# Patient Record
Sex: Female | Born: 1975 | Race: White | Hispanic: No | Marital: Single | Smoking: Current every day smoker
Health system: Southern US, Community
[De-identification: ages and names within clinical notes are randomized; demographics above are authoritative.]

## PROBLEM LIST (undated history)

## (undated) ENCOUNTER — Emergency Department (HOSPITAL_COMMUNITY): Admission: EM | Payer: Medicaid Other | Source: Home / Self Care

## (undated) DIAGNOSIS — I10 Essential (primary) hypertension: Secondary | ICD-10-CM

## (undated) DIAGNOSIS — N200 Calculus of kidney: Secondary | ICD-10-CM

## (undated) HISTORY — PX: EYE SURGERY: SHX253

## (undated) HISTORY — PX: ABDOMINAL HYSTERECTOMY: SHX81

## (undated) HISTORY — PX: CHOLECYSTECTOMY: SHX55

## (undated) HISTORY — PX: NECK SURGERY: SHX720

---

## 2000-09-23 ENCOUNTER — Encounter: Payer: Self-pay | Admitting: Internal Medicine

## 2000-09-23 ENCOUNTER — Emergency Department (HOSPITAL_COMMUNITY): Admission: EM | Admit: 2000-09-23 | Discharge: 2000-09-23 | Payer: Self-pay | Admitting: Internal Medicine

## 2001-03-06 ENCOUNTER — Emergency Department (HOSPITAL_COMMUNITY): Admission: EM | Admit: 2001-03-06 | Discharge: 2001-03-06 | Payer: Self-pay | Admitting: Emergency Medicine

## 2001-03-06 ENCOUNTER — Encounter: Payer: Self-pay | Admitting: Emergency Medicine

## 2002-01-09 ENCOUNTER — Emergency Department (HOSPITAL_COMMUNITY): Admission: EM | Admit: 2002-01-09 | Discharge: 2002-01-09 | Payer: Self-pay | Admitting: *Deleted

## 2002-01-14 ENCOUNTER — Encounter: Payer: Self-pay | Admitting: *Deleted

## 2002-01-14 ENCOUNTER — Ambulatory Visit (HOSPITAL_COMMUNITY): Admission: RE | Admit: 2002-01-14 | Discharge: 2002-01-14 | Payer: Self-pay | Admitting: *Deleted

## 2002-02-04 ENCOUNTER — Inpatient Hospital Stay (HOSPITAL_COMMUNITY): Admission: RE | Admit: 2002-02-04 | Discharge: 2002-02-07 | Payer: Self-pay | Admitting: *Deleted

## 2002-10-03 ENCOUNTER — Emergency Department (HOSPITAL_COMMUNITY): Admission: EM | Admit: 2002-10-03 | Discharge: 2002-10-04 | Payer: Self-pay | Admitting: Internal Medicine

## 2003-01-05 ENCOUNTER — Emergency Department (HOSPITAL_COMMUNITY): Admission: EM | Admit: 2003-01-05 | Discharge: 2003-01-05 | Payer: Self-pay | Admitting: Emergency Medicine

## 2003-01-05 ENCOUNTER — Encounter: Payer: Self-pay | Admitting: Emergency Medicine

## 2003-05-15 ENCOUNTER — Ambulatory Visit (HOSPITAL_COMMUNITY): Admission: RE | Admit: 2003-05-15 | Discharge: 2003-05-15 | Payer: Self-pay | Admitting: Family Medicine

## 2003-06-08 ENCOUNTER — Emergency Department (HOSPITAL_COMMUNITY): Admission: EM | Admit: 2003-06-08 | Discharge: 2003-06-09 | Payer: Self-pay | Admitting: Emergency Medicine

## 2003-06-19 ENCOUNTER — Ambulatory Visit (HOSPITAL_COMMUNITY): Admission: RE | Admit: 2003-06-19 | Discharge: 2003-06-19 | Payer: Self-pay | Admitting: Family Medicine

## 2004-12-31 ENCOUNTER — Emergency Department (HOSPITAL_COMMUNITY): Admission: EM | Admit: 2004-12-31 | Discharge: 2004-12-31 | Payer: Self-pay | Admitting: Emergency Medicine

## 2005-01-10 ENCOUNTER — Inpatient Hospital Stay (HOSPITAL_COMMUNITY): Admission: EM | Admit: 2005-01-10 | Discharge: 2005-01-15 | Payer: Self-pay | Admitting: Emergency Medicine

## 2005-01-10 ENCOUNTER — Ambulatory Visit: Payer: Self-pay | Admitting: Internal Medicine

## 2005-01-13 ENCOUNTER — Encounter (INDEPENDENT_AMBULATORY_CARE_PROVIDER_SITE_OTHER): Payer: Self-pay | Admitting: General Surgery

## 2005-04-18 ENCOUNTER — Emergency Department (HOSPITAL_COMMUNITY): Admission: EM | Admit: 2005-04-18 | Discharge: 2005-04-19 | Payer: Self-pay | Admitting: Emergency Medicine

## 2006-10-30 ENCOUNTER — Emergency Department (HOSPITAL_COMMUNITY): Admission: EM | Admit: 2006-10-30 | Discharge: 2006-10-30 | Payer: Self-pay | Admitting: Emergency Medicine

## 2006-12-01 ENCOUNTER — Emergency Department (HOSPITAL_COMMUNITY): Admission: EM | Admit: 2006-12-01 | Discharge: 2006-12-01 | Payer: Self-pay | Admitting: Emergency Medicine

## 2007-03-05 ENCOUNTER — Emergency Department (HOSPITAL_COMMUNITY): Admission: EM | Admit: 2007-03-05 | Discharge: 2007-03-05 | Payer: Self-pay | Admitting: Emergency Medicine

## 2007-04-23 ENCOUNTER — Ambulatory Visit (HOSPITAL_COMMUNITY): Admission: RE | Admit: 2007-04-23 | Discharge: 2007-04-23 | Payer: Self-pay | Admitting: Family Medicine

## 2007-05-27 ENCOUNTER — Ambulatory Visit (HOSPITAL_COMMUNITY): Admission: RE | Admit: 2007-05-27 | Discharge: 2007-05-27 | Payer: Self-pay | Admitting: Family Medicine

## 2007-06-30 ENCOUNTER — Emergency Department (HOSPITAL_COMMUNITY): Admission: EM | Admit: 2007-06-30 | Discharge: 2007-06-30 | Payer: Self-pay | Admitting: Emergency Medicine

## 2007-09-01 ENCOUNTER — Ambulatory Visit: Admission: RE | Admit: 2007-09-01 | Discharge: 2007-09-01 | Payer: Self-pay | Admitting: Neurology

## 2008-03-04 ENCOUNTER — Emergency Department (HOSPITAL_COMMUNITY): Admission: EM | Admit: 2008-03-04 | Discharge: 2008-03-05 | Payer: Self-pay | Admitting: Emergency Medicine

## 2008-03-17 ENCOUNTER — Encounter (HOSPITAL_COMMUNITY)
Admission: RE | Admit: 2008-03-17 | Discharge: 2008-04-16 | Payer: Self-pay | Admitting: Physical Medicine and Rehabilitation

## 2008-05-27 ENCOUNTER — Emergency Department (HOSPITAL_COMMUNITY): Admission: EM | Admit: 2008-05-27 | Discharge: 2008-05-27 | Payer: Self-pay | Admitting: Emergency Medicine

## 2008-07-07 ENCOUNTER — Encounter (HOSPITAL_COMMUNITY)
Admission: RE | Admit: 2008-07-07 | Discharge: 2008-08-06 | Payer: Self-pay | Admitting: Physical Medicine and Rehabilitation

## 2008-09-04 ENCOUNTER — Ambulatory Visit (HOSPITAL_COMMUNITY): Admission: RE | Admit: 2008-09-04 | Discharge: 2008-09-04 | Payer: Self-pay | Admitting: Family Medicine

## 2008-11-30 ENCOUNTER — Emergency Department (HOSPITAL_COMMUNITY): Admission: EM | Admit: 2008-11-30 | Discharge: 2008-12-01 | Payer: Self-pay | Admitting: Emergency Medicine

## 2008-12-19 ENCOUNTER — Emergency Department (HOSPITAL_COMMUNITY): Admission: EM | Admit: 2008-12-19 | Discharge: 2008-12-19 | Payer: Self-pay | Admitting: Emergency Medicine

## 2009-01-23 IMAGING — CR DG CHEST 2V
2 series · 2 of 2 positions shown · non-contrast
Comparison: None.

CLINICAL DATA: 30-year-old female with cough and sore throat.   
 CHEST ? 2 VIEW:

[view not recorded (1 of 2)]
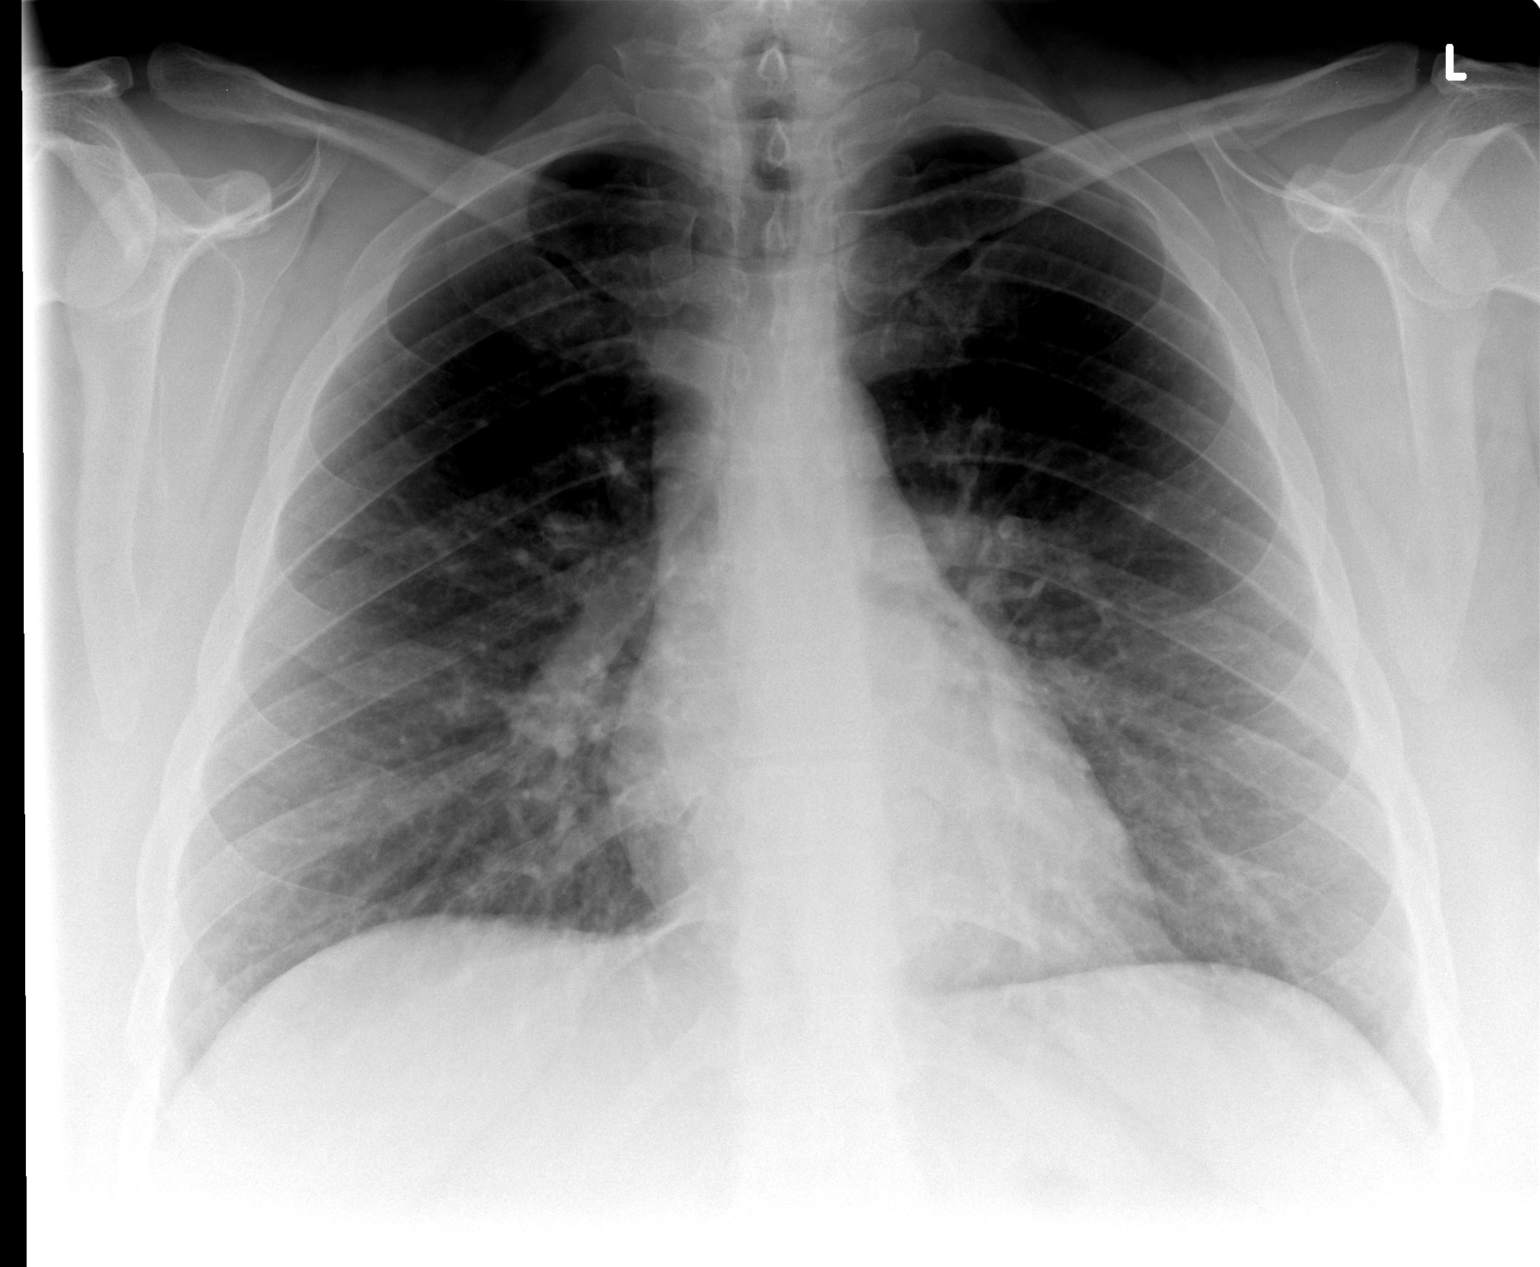

[view not recorded (2 of 2)]
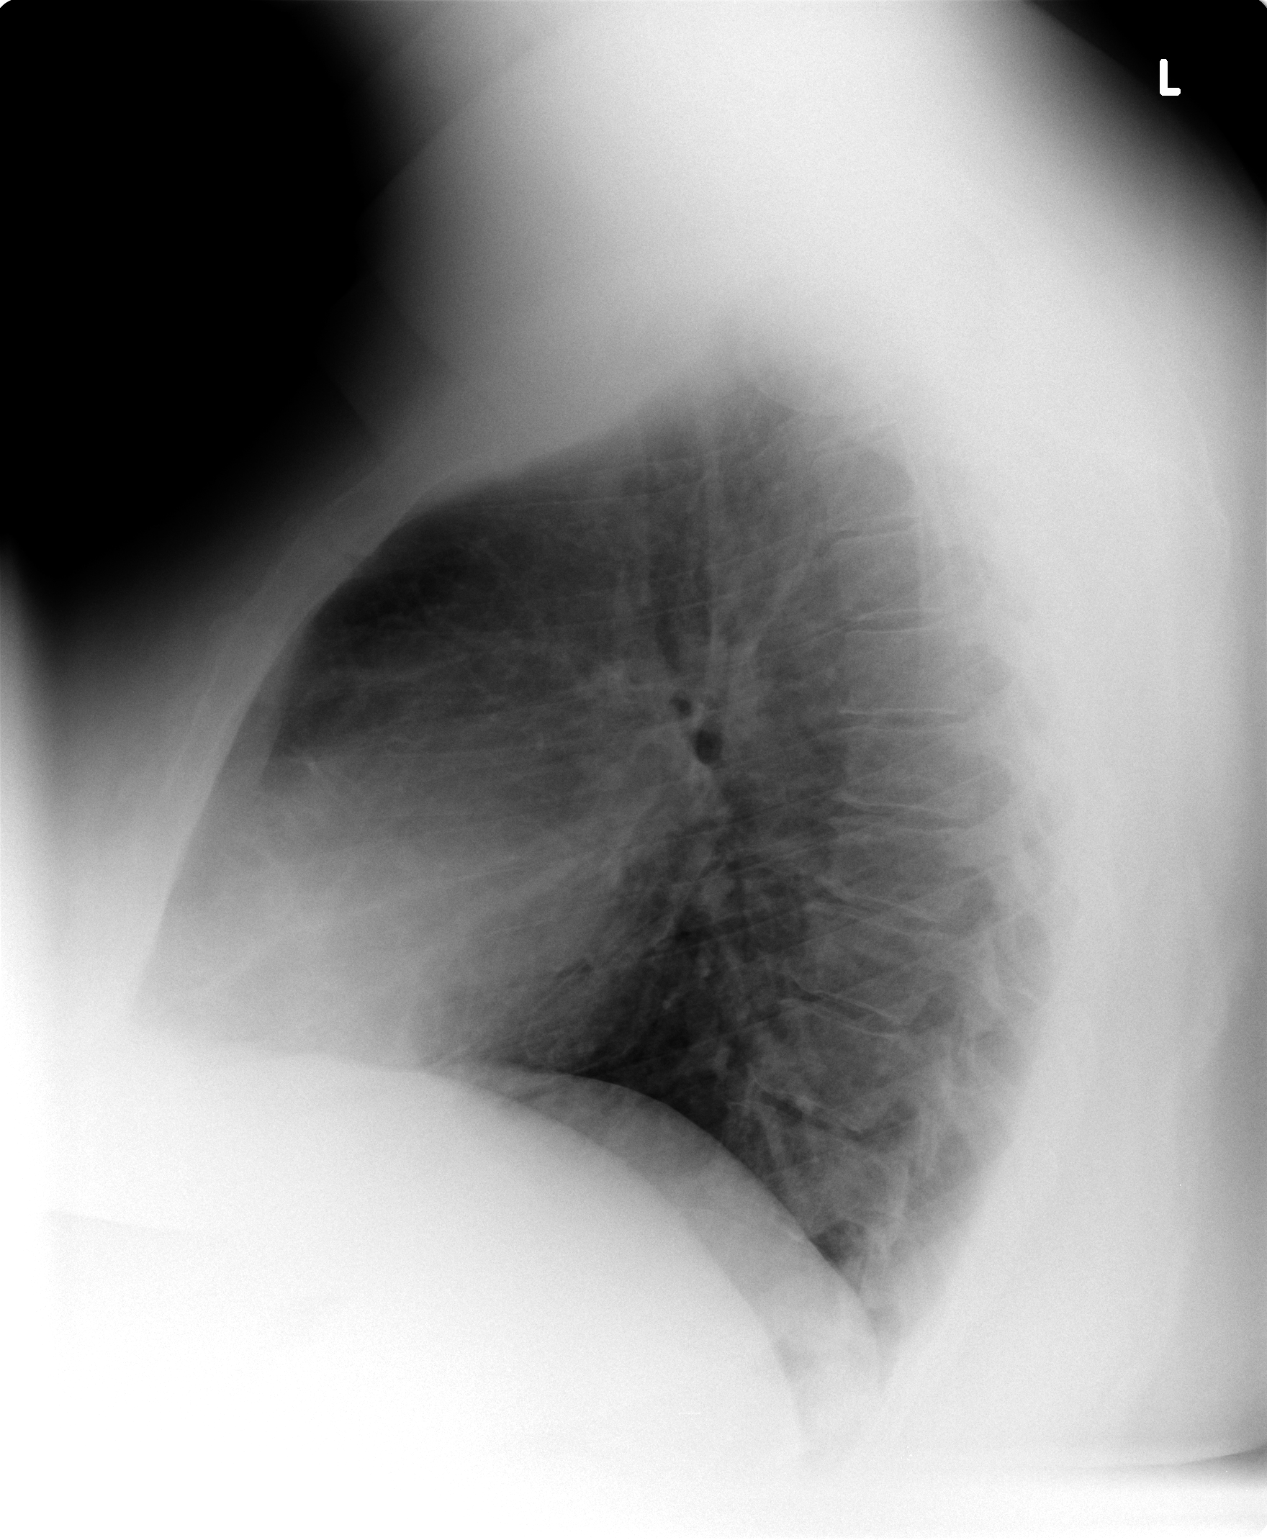

[2 of 2 positions shown; findings below may reference images not displayed]

FINDINGS: Normal cardiac size and mediastinal contour.  Lung volumes are within normal limits.  No pneumothorax.  No pleural effusion.  Mildly prominent vascular markings are noted at the lung bases.  No consolidation or focal airspace opacity.  No acute osseous abnormality.
IMPRESSION: No acute cardiopulmonary abnormality.

## 2009-09-29 ENCOUNTER — Ambulatory Visit (HOSPITAL_COMMUNITY): Admission: RE | Admit: 2009-09-29 | Discharge: 2009-09-29 | Payer: Self-pay | Admitting: Family Medicine

## 2009-10-07 ENCOUNTER — Encounter (INDEPENDENT_AMBULATORY_CARE_PROVIDER_SITE_OTHER): Payer: Self-pay | Admitting: *Deleted

## 2009-11-26 ENCOUNTER — Emergency Department (HOSPITAL_COMMUNITY): Admission: EM | Admit: 2009-11-26 | Discharge: 2009-11-26 | Payer: Self-pay | Admitting: Emergency Medicine

## 2010-02-11 ENCOUNTER — Emergency Department (HOSPITAL_COMMUNITY): Admission: EM | Admit: 2010-02-11 | Discharge: 2010-02-12 | Payer: Self-pay | Admitting: Emergency Medicine

## 2010-07-02 ENCOUNTER — Encounter: Payer: Self-pay | Admitting: Emergency Medicine

## 2010-07-12 NOTE — Letter (Signed)
Summary: Unable to Reach, Consult Scheduled  [facility]  [address]   [place], [place] [address]   Phone: [phone]  Fax: [phone]    10/07/2009  DELOMA SPINDLE [address] [place], [place]  [address] [birth year]   Dear Ms. Jacqulyn Ducking,   We have been unable to reach you by phone.  Please contact our office with an updated phone number.  At the recommendation of DR [facility] we have been asked to schedule you a consult with DR Jena Gauss for ABDOMINAL PAIN.  Please call our office at [phone].     Thank you,    Diana Eves  [facility] R. Roetta Sessions, M.D.    Jonette Eva, M.D. Lorenza Burton, FNP-BC    Tana Coast, PA-C Phone: [phone]    Fax: [phone]

## 2010-08-25 LAB — URINALYSIS, ROUTINE W REFLEX MICROSCOPIC
Bilirubin Urine: NEGATIVE
Glucose, UA: NEGATIVE mg/dL
Leukocytes, UA: NEGATIVE
Nitrite: NEGATIVE
Protein, ur: NEGATIVE mg/dL
Specific Gravity, Urine: 1.025 (ref 1.005–1.030)
Urobilinogen, UA: 0.2 mg/dL (ref 0.0–1.0)
pH: 6 (ref 5.0–8.0)

## 2010-08-25 LAB — URINE MICROSCOPIC-ADD ON

## 2010-08-30 ENCOUNTER — Ambulatory Visit (HOSPITAL_COMMUNITY)
Admission: RE | Admit: 2010-08-30 | Discharge: 2010-08-30 | Disposition: A | Discharge status: Home / Self Care | Payer: Medicaid Other | Source: Ambulatory Visit | Attending: Physical Medicine and Rehabilitation | Admitting: Physical Medicine and Rehabilitation

## 2010-08-30 DIAGNOSIS — M542 Cervicalgia: Secondary | ICD-10-CM | POA: Insufficient documentation

## 2010-08-30 DIAGNOSIS — M6281 Muscle weakness (generalized): Secondary | ICD-10-CM | POA: Insufficient documentation

## 2010-08-30 DIAGNOSIS — IMO0001 Reserved for inherently not codable concepts without codable children: Secondary | ICD-10-CM | POA: Insufficient documentation

## 2010-09-06 ENCOUNTER — Ambulatory Visit (HOSPITAL_COMMUNITY): Payer: Medicaid Other

## 2010-09-08 ENCOUNTER — Ambulatory Visit (HOSPITAL_COMMUNITY): Payer: Medicaid Other

## 2010-09-13 ENCOUNTER — Ambulatory Visit (HOSPITAL_COMMUNITY): Payer: Medicaid Other | Admitting: Physical Therapy

## 2010-09-18 LAB — RAPID STREP SCREEN (MED CTR MEBANE ONLY): Streptococcus, Group A Screen (Direct): NEGATIVE

## 2010-09-19 LAB — URINE MICROSCOPIC-ADD ON

## 2010-09-19 LAB — URINALYSIS, ROUTINE W REFLEX MICROSCOPIC
Glucose, UA: NEGATIVE mg/dL
Hgb urine dipstick: NEGATIVE
Leukocytes, UA: NEGATIVE
Nitrite: NEGATIVE
Protein, ur: 30 mg/dL — AB
Specific Gravity, Urine: 1.02 (ref 1.005–1.030)
Urobilinogen, UA: 2 mg/dL — ABNORMAL HIGH (ref 0.0–1.0)
pH: 6.5 (ref 5.0–8.0)

## 2010-09-19 LAB — PREGNANCY, URINE: Preg Test, Ur: NEGATIVE

## 2010-10-10 ENCOUNTER — Emergency Department (HOSPITAL_COMMUNITY)
Admission: EM | Admit: 2010-10-10 | Discharge: 2010-10-10 | Discharge status: Against Medical Advice | Payer: Medicaid Other | Attending: Emergency Medicine | Admitting: Emergency Medicine

## 2010-10-10 DIAGNOSIS — R109 Unspecified abdominal pain: Secondary | ICD-10-CM | POA: Insufficient documentation

## 2010-10-25 NOTE — Procedures (Signed)
Chloe [name], Chloe [name]                ACCOUNT NO.:  [id number]   MEDICAL RECORD NO.:  [id number]          PATIENT TYPE:  OUT   LOCATION:  SLEE                          FACILITY:  [facility]   PHYSICIAN:  Kofi A. Gerilyn Pilgrim, M.D. DATE OF BIRTH:  Nov 15, 1975   DATE OF PROCEDURE:  09/01/2007  DATE OF DISCHARGE:                             SLEEP DISORDER REPORT   POLYSOMNOGRAPHY REPORT   REFERRING PHYSICIAN:  Kofi Doonquah.   RECORDING DATE:  09/01/2007.   INDICATION:  A 35 year old lady who presents with snoring and is being  evaluated for obstructive sleep apnea syndrome.  BMI 51.  Epworth  sleepiness scale 16.   MEDICATION:  1. Hydrocodone.  2. Lorazepam.   SLEEP SUMMARY:  The total recording time is 144 in the diagnostic and  224 in the titration portion.  Sleep latency is 17 minutes.  No REM  latency is noted in the diagnostic portion.  REM latency in the  titration portion 30 minutes.   RESPIRATORY SUMMARY:  Baseline saturation 96%.  Lowest saturation 81%  during REM.  The non-REM lowest saturation 82%.  During the diagnostic  portion of study, patient had an AHI of 62 essentially all hypopneas  with 111 hypopneas and 1 obstructive event.  The patient was titrated  successfully to a pressure of 14.  She was started early with  supplemental oxygen, however, 2 L.   LEG MOVEMENT SUMMARY:  No nocturna leg movements observed.   ELECTROCARDIOGRAM SUMMARY:  Average heart rate 80%.  No significant  dysrhythmia is observed.   IMPRESSION:  Severe obstructive sleep apnea syndrome which responded  well to a pressure of 14 and 2 L of nasal oxygen bled in.  She was given  oxygen early, however, and may do just as well without nocturnal oxygen.      Kofi A. Gerilyn Pilgrim, M.D.  Electronically Signed    KAD/MEDQ  D:  09/03/2007  T:  09/04/2007  Job:  [id number]

## 2010-10-28 NOTE — Op Note (Signed)
NAME:  Chloe [name], Chloe [name]             ACCOUNT NO.:  [id number]   MEDICAL RECORD NO.:  [id number]            PATIENT TYPE:   LOCATION:                                FACILITY:  [facility]   PHYSICIAN:  R. Roetta Sessions, M.D.      DATE OF BIRTH:   DATE OF PROCEDURE:  DATE OF DISCHARGE:  01/15/2005                                 OPERATIVE REPORT   INDICATIONS FOR PROCEDURE:  The patient is a 35 year old lady who presents  with exacerbation of chronic, colicky, epigastric, right upper quadrant  abdominal pain.  LFTs are significantly elevated, the common bile duct is  dilated at 13 mm, on C10, there is a suspicion of CBD stones.  ERCP is now  being done.  This approach has been discussed with the patient and the  patient's mother at length.  Potential risks, benefits and alternatives have  been reviewed, specifically talked about the risk including, but not limited  to a 1/10 chance of pancreatitis, bleeding, perforation, potential for  failed procedure, the potential need for a stent.  Plans are to clear the  biliary tree prior to cholecystectomy which is planned for the near future.  All questions were answered, all parties were agreeable.   PROCEDURE NOTE:  The procedure was performed in the OR, and general  endotracheal anesthesia was induced by Dr. Jayme Cloud and associates.  The  patient was placed in the semiprone position.   INSTRUMENT:  Olympus video chip system.   ANTIBIOTICS:  Levaquin 250 mg IV prior to the procedure.   FINDINGS:  Cursory examination of the distal esophagus, stomach and duodenum  revealed no abnormalities.  The ampulla of Vater appeared normal and was  readily identifiable on the medial wall of the second portion of the  duodenum.  The scope was pulled back in the short position, 55 cm from the  incisors.  Scout film was obtained using the Microvasive sphincterotome.  The ampulla was impacted and guide wire palpation, duct lumen was found, a  __________ injection  produced partial opacification of the head and body  segment of the pancreatic duct, which appeared normal.  More tangential  approach was taken for biliary cannulation using guide wire palpation.  Fairly rapidly, a deep cannulation was obtained.  The sphincterotome  followed the wire up into the biliary tree.  Cholangiogram was performed.  The CBD, common hepatic duct, and entire biliary tree was diffusely dilated,  would estimate a good 13 to 14 mm.  The gallbladder was not opacified.  There were two, 3 mm filling defects floating in the distal CBD.  Please see  x-ray report.  The Safety wire was advanced deep into the biliary tree.  The  sphincterotome was pulled back across the ampullary orifice at 12 o'clock  position.  A sphincterotomy, approximately 1 cm in length was performed at  the 12 o'clock position using the ERBE unit, and this was done without  apparent complications.  Subsequently the graduated balloon was advanced to  the confluence.  It was inflated up to the diameter of the duct, and a  balloon  cholangiogram was obtained, a pull-through yielded a single, intact  stone and sludge, and debris.  The balloon was pulled through twice.  There  was excellent drainage of contrast through the ampullary orifice for this  maneuver.  The patient tolerated the procedure well and was taken to the  PACU for reaction.   IMPRESSION:  1.  Normal ampulla.  2.  Diffusely dilated biliary tree with choledocholithiasis.  3.  Status post endoscopic sphincterotomy stone extraction.  4.  Partial opacification of the head and body segments of pancreatic duct      revealed no abnormalities.   RECOMMENDATIONS:  1.  Diet as tolerated.  2.  Check laboratory studies tomorrow morning.  3.  Laparoscopic cholecystectomy tomorrow per Dr. Malvin Johns.       RMR/MEDQ  D:  01/12/2005  T:  01/12/2005  Job:  [id number]   cc:   Barbaraann Barthel, M.D.  [address]  [place]  [place] [address]  Fax: [phone]

## 2010-10-28 NOTE — Discharge Summary (Signed)
Chloe [name], Chloe [name]                       ACCOUNT NO.:  [id number]   MEDICAL RECORD NO.:  [id number]                   PATIENT TYPE:  INP   LOCATION:  A425                                 FACILITY:  [facility]   PHYSICIAN:  Roylene Reason. Lisette Grinder, M.D.             DATE OF BIRTH:  01-22-1976   DATE OF ADMISSION:  02/04/2002  DATE OF DISCHARGE:  02/07/2002                                 DISCHARGE SUMMARY   DIAGNOSIS:  Menorrhagia, dysmenorrhea, and irregular menses.   PROCEDURE:  Total abdominal hysterectomy.   FOLLOW UP:  The patient is to follow-up in the office in four days time for  staple removal from midline abdominal incision.   DISCHARGE MEDICATIONS:  1. Tussionex.  2. Tylox.  3. Albuterol inhaler.   LABORATORY DATA:  HCG is negative. Admission hemoglobin and hematocrit of  14.6 and 42.8 with a white count of 8.3. On postoperative day one hemoglobin  and hematocrit 11.8 and 34.3 with a white count of 9.4.   HOSPITAL COURSE:  The patient had a total abdominal hysterectomy performed  on January 25, 2002. She did well postoperative with excellent urine output.  The patient requested that her Foley catheter be removed on the evening of  February 04, 2002. On February 05, 2002 the patient had the Foley catheter out.  She was minimally ambulatory and only able to void. She remained afebrile.  The Jackson-Pratt drains were draining bloody fluid. Abdomen was soft.  However, the patient had no gas pains and no passing of flatus. IV fluids  were continued and the patient continued to require IV narcotics. On February 06, 2002 the patient again has not passed any flatus. She complains of  headache at this time and chest pain secondary to coughing. She has a  nonproductive cough with complaints of significant chest pain. She provided  history that at home she had used her son's Nebulizer with good results. On  examination, she was noted to be experiencing scattered rales and rhonchi in  the  lungs. In addition, she had a postoperative ileus, thus her  hospitalization was continued from February 06, 2002 to February 07, 2002. On  February 06, 2002 the patient was treated with Albuterol bedside inhaler. In  addition, she was treated with Tussionex and tried on po narcotics for pain  relief. The patient did have improvement on this regimen. She ambulated  increasingly well on February 06, 2002. On February 07, 2002 the chest pain has  improved with cessation of coughing. She has begun to bring up phlegm with  the coughing effort. She had passed gas, now with significant decrease in  abdominal pain. Thus, she was discharged home on February 07, 2002, given a  copy of standard discharge instructions and will follow-up early next week  for staple removal from the midline abdominal incision. Jackson-Pratt drains  have been removed on the morning of February 06, 2002 and on February 07, 2002  the abdomen was soft.  The skin edges are well approximated with no separation. There is no  erythema and no significant induration at the midline abdominal site.   DISPOSITION:  The patient is discharged to home.                                                Donald P. Lisette Grinder, M.D.    DPC/MEDQ  D:  02/07/2002  T:  02/07/2002  Job:  [id number]

## 2010-10-28 NOTE — Op Note (Signed)
Chloe [name], Chloe [name]                       ACCOUNT NO.:  [id number]   MEDICAL RECORD NO.:  [id number]                   PATIENT TYPE:  INP   LOCATION:  A425                                 FACILITY:  [facility]   PHYSICIAN:  Roylene Reason. Lisette Grinder, M.D.             DATE OF BIRTH:  1975/08/18   DATE OF PROCEDURE:  DATE OF DISCHARGE:                                 OPERATIVE REPORT   PREOPERATIVE DIAGNOSES:  Menorrhagia, dysmenorrhea, irregular menses.   POSTOPERATIVE DIAGNOSES:  Menorrhagia, dysmenorrhea, irregular menses.   PROCEDURE:  Total abdominal hysterectomy.   SURGEON:  Roylene Reason. Lisette Grinder, M.D.   ESTIMATED BLOOD LOSS:  250 cc.   COMPLICATIONS:  None.   SPECIMENS:  Permanent pathology only.   DRAINS:  Foley catheter, two JP drains in the subcutaneous space.   FINDINGS AT THE TIME OF SURGERY:  A diffusely enlarged uterus predominantly  in the fundal portion with a globular fundus. In addition there was evidence  of multiple follicular development bilaterally in the ovaries with evidence  on the left ovary of a recent ovulation, and no adhesive disease or evidence  of any endometriosis was encountered.   DESCRIPTION OF PROCEDURE:  The patient was taken to the operating room.  Vital signs were stable.  Prepped and draped in the usual sterile manner.  Foley catheter straight drainage, clear yellow urine obtained.  A sharp  knife was used to incise a midline abdominal incision, dissecting down to  the fascial plane cauterizing bleeders along the way.  The fascia was then  likewise incised in a vertical manner.  Utilizing Mayo scissors while  sharply dissecting off the end of the line.  The rectus muscles were bluntly  separated.  Peritoneal cavity atraumatically bluntly entered at the superior-  most portion of the incision, peritoneal incision extended superiorly and  inferiorly, inferiorly to visualize to bladder to avoid its accidental  entry.  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   A Balfour self-retaining retractor was placed.  Three moist laps were placed  to mobilize the bladder out of the operative fields.  Bladder blade was also  placed.  A long straight Kocher clamp was used to grasp the specimen at the  junction of the  round ligament and fallopian tube with the uterus.  Each of  the round ligaments were transected first, first on the right.  The round  ligament treated with #0 Vicryl in a Heaney fashion.  The right round  ligament was transected. Utero-ovarian ligament skeletonized, doubly clamped  with Kelly clamps, and then doubly ligated first with free tie of #0 Vicryl  followed by suture ligature of #0 Vicryl in a Heaney fashion.  Likewise we  did the same procedure on the left.  Securing first the round ligament, then  the utero-ovarian  ligament which was likely similarly further ligated.   We then used sharp dissection with Metzenbaum scissors to skeletonize the  uterine vessels bilaterally.  We continued across the anterior lower uterine  segment and in the bladder area to dissect in an avascular plane.  Bladder  flap was then mobilized distal to the cervix.  The right uterine vessel  clamped first. A Kelly clamp placed for backbleeding.  A curved Heaney clamp  was then placed.  The right uterine vessel was ligated with #0 Vicryl.  The  left uterine vessel was then clamped with curved Heaney clamps and then  ligated with #0 Vicryl.  Then, going down to the cardinal ligaments, the  right cardinal ligament was clamped with a straight Heaney clamp, suture in  a Heaney fashion was utilized.  Some active bleeding is encountered distal  to the clamp of the ligation of the right cardinal ligament thus a second  straight Heaney clamp was utilized followed by suture ligature of #0 Vicryl  in Heaney fashion; this likewise secured to  the additional pedicle and  controlled the bleeding __________ first.   The left side is then clamped with a straight Heaney clamp to clamp the  cardinal ligament.  This was then securely ligated.  Each of the uterosacral  ligaments were then clamped subsequently, first the right with a curved  Heaney clamp followed by a suture ligature of #0 Vicryl in a Heaney fashion.  Left clamp likewise curved Heaney clamp, #0 Vicryl in a Heaney fashion.  This then brought me down to the level of the vaginal angle.  The right  vaginal angle was angled first.  A curved Heaney clamp was placed followed  by ligature of #0 Vicryl in a Heaney fashion.  This was tagged for latter  inspection.  The left vaginal angle was then clamped with curved Heaney  clamp followed by suture ligature of #0 Vicryl in a Heaney fashion.  This  then allowed me to transect across the uppermost portion of the vaginal  cuff, maximizing vaginal length, while at the same time, removing it as a  specimen.  Examination revealed the entire cervix to be encountered.   The edge of the vaginal cuff were then grasped using straight Kocher clamps.  The vaginal cuff was irrigated with Betadine solution.  The vaginal cuff was  then closed with 3 suture ligatures of #0 Vicryl in a Heaney fashion.  This  results in excellent hemostasis.  The pelvis was then irrigated with sterile  normal saline; hemostatis being assured. Moist packs were removed.  Self-  retaining retractors removed and continues to drain clear yellow urine.  Peritoneal edges were grasped using Kelly clamps. The peritoneum was closed  with a continuous running #0 chromic suture.  The fascia was closed with a  #1 Novofil, double stranded suture being careful to take out wide, 1-cm  bites.  The subcutaneous bleeders were then cauterized.  Two JP drains were  placed in the subcutaneous space with separate exit wounds superiorly at the incision.  Subcutaneous tissue was then  irrigated with the Betadine  solution.  Retention-type sutures of #0 Novofil are then utilized.  This  reapproximated the skin edges.  The skin is then completely closed utilizing  the skin staples, which then results in a simple closure.  A total of about  30 cc of 0.5% bupivacaine was then injected subcutaneously along the edges  of the incision to facilitate postoperative analgesia.  The patient  tolerated  the procedure very well.  She was extubated, taken to the recovery  room in stable condition at which time operative findings were discussed  with the patient's awaiting family.                                               Donald P. Lisette Grinder, M.D.    DPC/MEDQ  D:  02/04/2002  T:  02/06/2002  Job:  [id number]

## 2010-10-28 NOTE — Discharge Summary (Signed)
NAMESHAYNAH, HUND             ACCOUNT NO.:  [id number]   MEDICAL RECORD NO.:  [id number]          PATIENT TYPE:  INP   LOCATION:  A326                          FACILITY:  [facility]   PHYSICIAN:  Barbaraann Barthel, M.D. DATE OF BIRTH:  Mar 29, 1976   DATE OF ADMISSION:  01/10/2005  DATE OF DISCHARGE:  08/06/2006LH                                 DISCHARGE SUMMARY   DIAGNOSIS:  Cholecystitis, cholelithiasis, and choledocholithiasis.   CONSULTATIONS:  Department of surgery, Dr. Malvin Johns, for laparoscopic  cholecystectomy performed on January 13, 2005.   CONSULTATION:  To the GI service, Dr. Jena Gauss, for an ERCP and sphincterotomy  performed on January 12, 2005.   NOTE:  This is a 35 year old white female who had a long-term history of  right upper quadrant pain, nausea and vomiting. Had been seen on several  occasions in the emergency room for these symptoms. She was seen  approximately two weeks prior to coming at this particular onset and then  returned and was admitted when a CT scan revealed cholelithiasis and  cholecystitis. Sonogram later revealed that this patient had dilated hepatic  radicals and choledocholithiasis. We then consulted the GI people who  performed an ERCP on January 12, 2005, and a sphincterotomy and  choledocholithotomy was performed that. The patient did very well from this.  She was then scheduled for laparoscopic cholecystectomy on January 13, 2005.  This went well. Laparoscopic procedure was able to be done. She then did  well postoperatively, and she was discharged on the second postoperative day  at which time she was taking p.o. well. Her Jackson-Pratt drain had minimal  serous drainage. This was removed. The wounds were clean without signs of  infection. She was tolerating p.o. without discomfort, and she had no  shortness of breath or dysuria or leg pain. Liver function studies also had  a continued downward trend with a normal bilirubin.   LABORATORY DATA:  As  stated, this patient's CT scan showed cholecystitis and  cholelithiasis. The patient's sonogram revealed dilated hepatic radicals  with choledocholithiasis.   Liver function studies showed her to have elevated liver function studies at  time of admission with an AST of 658, ALT of 599 and alkaline phosphatase of  139 and total bilirubin 1.6. This was repeated, and the bilirubin was 1.7,  with a direct of 1.0 with an indirect of 0.7, lipase was not elevated.  Neither was the amylase. After choledochal lithotomy which was performed on  January 12, 2005,  her liver function studies on January 13, 2005 showed an AST  of 41, an ALT of 207, an alkaline phosphatase 121 and a total bilirubin is  0.9. She underwent surgery on January 13, 2005, and afterwards, her liver  function studies showed a continued downward trend of her liver function  studies and mild elevation of her AST at 48, ALT down to 162, alkaline  phosphatase 156, total bilirubin 1.1, and amylase not elevated.   Her urinalysis was grossly within normal limits, some small amount of  bilirubin noted within it.   HOSPITAL COURSE:  Her hospital course, as mentioned above  unremarkable. She  did well after both of these procedures, and she was discharged on her  second postoperative day.   DISCHARGE INSTRUCTIONS:  Discharge instructions included full liquid to soft  diet. She is told to increase her activities as tolerated. She is permitted  to shower. She is told not to going into the tub or Jacuzzi or swimming  pool. She may walk up and down the stairs. She is told to do no heavy  lifting or driving immediately postoperatively and no sexual activity.  Refrain from aspirin products. Clean the wound with alcohol as instructed  and dress her drain site as also instructed. Darvocet-N 100 one tablet every  4 hours as needed for pain, and she is to resume her preoperative  medications which were essentially her acid reflux medications as per  Dr.  Renard Matter. We have made plans to see her on Thursday in follow-up. She is to  told to call me should there be any acute changes.      Barbaraann Barthel, M.D.  Electronically Signed     WB/MEDQ  D:  01/15/2005  T:  01/16/2005  Job:  [id number]   cc:   R. Roetta Sessions, M.D.  [address]  [place]  [place] [address]

## 2010-10-28 NOTE — Group Therapy Note (Signed)
Chloe [name], Chloe [name]             ACCOUNT NO.:  [id number]   MEDICAL RECORD NO.:  [id number]          PATIENT TYPE:  INP   LOCATION:  A326                          FACILITY:  [facility]   PHYSICIAN:  Angus G. Renard Matter, MD   DATE OF BIRTH:  May 30, 1976   DATE OF PROCEDURE:  DATE OF DISCHARGE:                                   PROGRESS NOTE   This patient had laparoscopic cholecystectomy done without complications on  01/13/05.  The patient remains afebrile.  She has upper abdominal soreness.   OBJECTIVE:  Vital signs: Blood pressure 113/68, respirations 20, pulse 59,  temperature 98.3.  Lungs: Clear to P&A.  Heart: Regular rhythm.  Abdomen:  Soreness over abdominal incisions.   ASSESSMENT:  The patient has had laparoscopic cholecystectomy for  cholecystitis and cholelithiasis one day postoperative.   PLAN:  Continue current regimen.       AGM/MEDQ  D:  01/14/2005  T:  01/14/2005  Job:  [id number]

## 2010-10-28 NOTE — Op Note (Signed)
[name], [name]             ACCOUNT NO.:  [id number]   MEDICAL RECORD NO.:  [id number]          PATIENT TYPE:  INP   LOCATION:  A326                          FACILITY:  [facility]   PHYSICIAN:  Barbaraann Barthel, M.D. DATE OF BIRTH:  12-20-1975   DATE OF PROCEDURE:  01/13/2005  DATE OF DISCHARGE:                                 OPERATIVE REPORT   DIAGNOSIS:  1.  Cholecystitis, cholelithiasis.  2.  Status post endoscopic retrograde cholangiopancreatography for      choledocholithiasis.   PROCEDURE:  Laparoscopic cholecystectomy.   SPECIMEN:  Gallbladder and stones.   NOTE:  This is a 35 year old white female who had long-term history of right  upper quadrant pain, nausea and vomiting. She was admitted through the  emergency room with elevated liver function studies and right upper quadrant  pain. CT scan was performed which revealed cholelithiasis. Sonogram revealed  possible common bile duct stone with dilatation of the hepatic radicals. The  GI service saw the patient in consultation. ERCP this and sphincterotomy was  performed on January 12, 2005 with plans for laparoscopic cholecystectomy the  following day.   We discussed procedure in detail, discussing complications not limited to  but including bleeding, infection, damage to bile duct, perforation of  organs and transitory diarrhea. We also discussed the possibility that open  cholecystectomy may be needed. Informed consent was obtained.   GROSS OPERATIVE FINDINGS:  The patient had a slightly dilated cystic duct.  No stones were palpated within it. Dilated gallbladder which was drained of  approximately 20 cc of the crank case like bile. The right upper quadrant  otherwise appeared to be normal. There was considerable edema still present  in the gallbladder itself. There was fatty infiltration of the liver noted.  Otherwise, the right upper quadrant was grossly within normal limits.   TECHNIQUE:  The patient was placed in  supine position. After the adequate  administration of general anesthesia via endotracheal intubation, her entire  abdomen was prepped with Betadine solution and draped in the usual manner. A  Foley catheter was aseptically inserted. With with the patient  Trendelenburg, a periumbilical incision was carried out on the superior  aspect of the umbilicus. The fascia was grasped and elevated with a sharp  towel clip, and the Veress needle was inserted and confirmed in position  with a saline drop test. We then insufflated the abdomen with approximately  4 liters of CO2 and then placed the 11-mm cannula using the Visiport  technique in the umbilicus and then under direct vision three other  cannulas, an 11-mm cannula in the epigastrium and two 5-mm cannulas in the  right upper quadrant laterally. The gallbladder was then grasped. Its  adhesions were taken down. We needed to decompress the gallbladder prior to  grabbing it, and approximately 20 cc were removed by needle aspiration. The  gallbladder was then grasped. Its adhesions were taken down. The cystic duct  was clearly visualized, quadruply silver clipped and divided as was the  cystic artery. The gallbladder was then removed using the hook cautery  device from the liver bed. This  was done fairly easily as there was  considerable edema which helped the dissection. The gallbladder was then  removed using the EndoCatch device. We cauterized the liver bed after  removing the gallbladder and irrigated and checked for hemostasis. I elected  to leave the Surgicel within the liver bed and a Jackson-Pratt drain which  exited from the lateral 5-mm cannula sites. After checking for hemostasis,  the abdomen was then desufflated, and we then closed the fascia and the  areas in the epigastric and the umbilicus incision with 0 Polysorb and  closed the skin with stapling device. All incisions were infiltrated with  1/2% Sensorcaine to help with  postoperative comfort. The drain was sutured  in place with 3-0 nylon. Prior to closure, all sponge, needle and instrument  counts were found to be correct. Estimated blood loss was minimal. The  patient received 1,100 cc crystalloids intraoperatively. One Jackson-Pratt  in drain liver bed, and there were no complications.      Barbaraann Barthel, M.D.  Electronically Signed     WB/MEDQ  D:  01/13/2005  T:  01/14/2005  Job:  [id number]   cc:   Angus G. Renard Matter, MD  [address]  [place]  [place] [address]  Fax: [phone]   R. Roetta Sessions, M.D.  [address]  [place]  [place] [address]

## 2010-10-28 NOTE — H&P (Signed)
Chloe [name], GUIZAR             ACCOUNT NO.:  [id number]   MEDICAL RECORD NO.:  [id number]           PATIENT TYPE:  INP   LOCATION:  A326                          FACILITY:  [facility]   PHYSICIAN:  Angus G. Renard Matter, MD   DATE OF BIRTH:  16-Jul-1975   DATE OF ADMISSION:  DATE OF DISCHARGE:  LH                                HISTORY & PHYSICAL   HISTORY OF PRESENT ILLNESS:  This 35 year old white female was seen in ED at  [facility] with a chief complaint of right upper quadrant  pain.  Apparently, pain had been present intermittently over a period of  approximately two weeks.  She was evaluated by ED physician.  Apparently, CT  was done of abdomen and pelvis which revealed multiple tiny gallstones with  dilated bile ducts.  No visible common bile duct stone.   LABORATORY DATA:  CBC:  WBC 5100, hemoglobin 13.7, hematocrit 39.4.  Chemistry:  Sodium 139, potassium 3.7, chloride 110, CO2 25, glucose 110,  BUN 8, creatinine 0.7, calcium 8.7.  Liver enzymes:  SGOT 658, SGPT 599, alk-  phos 139, bilirubin 1.6, lipase 24.   SOCIAL HISTORY:  The patient does smoke a half pack of cigarettes daily.  Does not use alcohol.   FAMILY HISTORY:  Noncontributory.   PAST MEDICAL/SURGICAL HISTORY:  1.  Hysterectomy.  2.  Sleep apnea.  3.  History of peptic ulcer disease.   ALLERGIES:  No allergies.   MEDICATIONS:  Prilosec 40 mg daily.   REVIEW OF SYSTEMS:  HEENT:  Negative.  Cardiopulmonary:  No chest pain or  cough.  GI:  Upper abdominal pain, minimal vomiting.  GU:  No dysuria or  hematuria.   PHYSICAL EXAMINATION:  GENERAL APPEARANCE:  Alert female, uncomfortable.  VITAL SIGNS:  Blood pressure 119/70, pulse 58, respirations 20.  HEENT:  Eyes:  PERRLA.  TM negative.  Oropharynx benign.  NECK:  Supple.  No JVD or thyroid abnormalities.  LUNGS:  Clear to P&A.  HEART:  Regular rate and rhythm.  No murmurs.  ABDOMEN:  No palpable organs or masses.  Tenderness in the epigastric  area.  No organomegaly.  SKIN:  Warm and dry.  EXTREMITIES:  Free of edema.  NEUROLOGICAL:  No focal deficit.   DIAGNOSES:  Cholelithiasis, upper abdominal pain.       AGM/MEDQ  D:  01/11/2005  T:  01/11/2005  Job:  [id number]

## 2010-10-28 NOTE — Consult Note (Signed)
Chloe [name], Chloe [name]             ACCOUNT NO.:  [id number]   MEDICAL RECORD NO.:  [id number]          PATIENT TYPE:  INP   LOCATION:  A326                          FACILITY:  [facility]   PHYSICIAN:  R. Roetta Sessions, M.D. DATE OF BIRTH:  19-Nov-1975   DATE OF CONSULTATION:  01/11/2005  DATE OF DISCHARGE:                                   CONSULTATION   REASON FOR CONSULTATION:  Possible choledocholithiasis.   PHYSICIAN REQUESTING CONSULTATION:  Barbaraann Barthel, M.D.   HISTORY OF PRESENT ILLNESS:  The patient is a 35 year old Caucasian female  who presented to the emergency department yesterday with recurrent  postprandial epigastric/right upper quadrant abdominal pain with radiation  into her back.  She states she has had these symptoms intermittently for  approximately 2 years.  However, about 2 weeks ago the symptoms became more  frequent and severe.  She came to the emergency department a couple weeks  ago, and tells me that she did not have any kind of diagnostic evaluation or  imaging studies.  She was told she had an ulcer and was asked to take  Prilosec OTC daily.  She has done this over the last couple of weeks, along  with other agents including a bottle of Mylanta, a bottle of Pepto-Bismol,  Alka-Seltzer, and Tylenol P.M. with no relief.  Symptoms progressed;  therefore, she came back to the emergency department last night and had a CT  of the abdomen and pelvis, which revealed multiple gallstones, a slightly  distended gallbladder, slightly distended intrahepatic and extrahepatic  biliary dilatation, and common bile duct diameter of 13 mm, but no discrete  stone seen in the duct.  She also had a possible left ovarian cyst.  Yesterday, her total bilirubin was 1.6 (today it is 1.6), direct bilirubin  0.8, alkaline phosphatase 137.  Her AST was 658, and now 355.  Her ALT was  599, now 510.  Albumin 3.3, lipase 21.  Today, she has not had much pain  until this afternoon.  She has  had some right upper quadrant pain.  Generally, she is chronically constipated, and generally has a bowel  movement every couple of days.  She has had some intermittent hematochezia,  which she feels is related to hemorrhoids.  Denies any melena, but states  her stools are dark and chalky, and has been on Pepto-Bismol within the last  week.  She has had multiple episodes of vomiting, but no hematemesis.  Denies any heartburn.   MEDICATIONS PRIOR TO ADMISSION:  1.  OTC Prilosec 20 mg daily.  2.  Mylanta p.r.n.  3.  Pepto-Bismol p.r.n.  4.  Alka-Seltzer p.r.n.  5.  Tylenol P.M. p.r.n.   ALLERGIES:  No known drug allergies.   PAST MEDICAL HISTORY:  1.  Sleep apnea requiring CPAP.  2.  She has had a hysterectomy.  3.  Right eye surgery as a child.  4.  D&C.   FAMILY HISTORY:  Negative for colorectal cancer.  Her father died of renal  cancer.  Maternal grandmother has stomach cancer.   SOCIAL HISTORY:  She is single.  She  has 3 children, ages 52, 34, and 67.  She  is unemployed.  She smokes about a pack of cigarettes daily.  She smokes  marijuana off an on for the last 7 years (the last time was a couple weeks  ago.)  Denies any IV or intranasal drug use.  She generally does not consume  alcohol, but tells me she drank about 3 beers last weekend.   REVIEW OF SYSTEMS:  See HPI for GI.  CARDIOPULMONARY:  Denies any shortness  of breath or chest pain.  GENITOURINARY:  No dysuria.   PHYSICAL EXAMINATION:  VITAL SIGNS:  Temperature 97.2, pulse 69,  respirations 20, blood pressure 106/63.  GENERAL:  Pleasant, obese, Caucasian female in no acute distress.  SKIN:  Warm and dry, no jaundice.  HEENT:  Conjunctivae are pink.  Sclerae are nonicteric.  Oropharyngeal  mucosa moist and pink.  No lesions, erythema, or exudate.  No  lymphadenopathy or thyromegaly.  CHEST:  Lungs are clear to auscultation.  CARDIAC:  Regular rate and rhythm.  Normal S1 and S2.  No murmurs, rubs, or  gallops.   ABDOMEN:  Positive bowel sounds, obese, but symmetrical, soft.  Moderate  right upper quadrant tenderness to deep palpation.  No organomegaly or  masses.  No rebound tenderness or guarding.  No abdominal bruits or hernias.  EXTREMITIES:  No edemas.   LABORATORY DATA:  White count 5700, hemoglobin 13, hematocrit 37, platelets  178,000.  Eosinophil count 9%.  Sodium 135, potassium 3.8, BUN 5, creatinine  0.8, glucose 87.  CT as outlined above.   IMPRESSION:  The patient is a 35 year old Caucasian female with symptoms  consistent with biliary colic who now, on a CT, has a dilated common bile  duct diameter of 13 mm, multiple gallstones, and elevated LFT's.  No  discrete stone has been seen in the common bile duct, but it is very likely  that she does have choledocholithiasis, or has recently passed a stone.   PLAN:  1.  Will recheck her LFT's in the morning.  Unless the numbers plummet, she      will need to have an ERCP with an endoscopic sphincterotomy and stone      extraction prior to a cholecystectomy.  I suspect that this will be the      case.  2.  Lipase.  PTT, bleeding time, __________.  3.  Continue Rocephin for now.   I would like to thank Dr. Barbaraann Barthel for allowing [place] to take part in  the care of this patient.       LL/MEDQ  D:  01/11/2005  T:  01/11/2005  Job:  [id number]   cc:   Barbaraann Barthel, M.D.  [address]  [place]  [place] [address]  Fax: [phone]   Angus G. Renard Matter, MD  [address]  [place]  [place] [address]  Fax: [phone]

## 2010-10-28 NOTE — Group Therapy Note (Signed)
NAMEGINGER, LEETH             ACCOUNT NO.:  [id number]   MEDICAL RECORD NO.:  [id number]          PATIENT TYPE:  INP   LOCATION:  A326                          FACILITY:  [facility]   PHYSICIAN:  Angus G. Renard Matter, MD   DATE OF BIRTH:  06-26-1975   DATE OF PROCEDURE:  DATE OF DISCHARGE:                                   PROGRESS NOTE   This patient was admitted with right upper quadrant pain.  She had ERCP  yesterday with sphincterotomy and removal of some stones.  Apparently is  scheduled for laparoscopic cholecystectomy today.  Patient's condition  remains stable.   OBJECTIVE:  VITAL SIGNS:  Blood pressure 101/52, respirations 20, pulse 63,  temp 98.  LUNGS:  Diminished breath sounds.  HEART:  Regular rate and rhythm.  ABDOMEN:  Slight tenderness in the upper abdomen.   Liver enzymes remain slightly elevated.  SGOT 124, SGPT 325, bilirubin 0.9.   ASSESSMENT:  1.  Cholecystitis.  2.  Cholelithiasis.   PLAN:  To proceed with a laparoscopic cholecystectomy today.       AGM/MEDQ  D:  01/13/2005  T:  01/13/2005  Job:  [id number]

## 2010-10-28 NOTE — Consult Note (Signed)
Chloe [name], Chloe [name]             ACCOUNT NO.:  [id number]   MEDICAL RECORD NO.:  [id number]          PATIENT TYPE:  INP   LOCATION:  A326                          FACILITY:  [facility]   PHYSICIAN:  Barbaraann Barthel, M.D. DATE OF BIRTH:  Apr 08, 1976   DATE OF CONSULTATION:  01/11/2005  DATE OF DISCHARGE:                                   CONSULTATION   Surgery was asked to see this 35 year old white female for right upper  quadrant pain and cholelithiasis as seen by CT scan.   CHIEF COMPLAINT:  Right upper quadrant pain, nausea, and vomiting.   HISTORY OF PRESENT MEDICAL ILLNESS:  The patient states that she has had  symptoms of postprandial right upper quadrant pain beginning in the  epigastrium and radiating across her right upper quadrant and back to her  back for approximately one year's duration. This has occasional been  accompanied with nausea and vomiting.  She saw her medical doctor, and he  prescribed antacids for reflux disease, and she was told that she possible  could have an ulcer.  She was treated with Prilosec and Pepcid.  Symptoms  continued with only mild abatement.  She never had an upper GI endoscopy.  These symptoms continued on an off for over a year, and she came to the  emergency room two weeks ago with similar type symptoms. Again she was  treated for GERD-type symptoms, no diagnostic studies or imaging studies  were performed.  She returned to the emergency room last night where CT scan  showed her to have cholelithiasis, thickened gallbladder with multiple  stones within the gallbladder, and a dilated common bile duct.  She was  admitted to the medical service, and surgery was consulted.   PHYSICAL EXAMINATION:  GENERAL:  Pleasant 35 year old white female who is in  no acute distress.  VITAL SIGNS:  She weighs 223 pounds.  She is 5 feet 6 inches.  Temperature  98.2, blood pressure 110/65, heart rate 53 per minutes, respirations 20 per  minute.  HEAD:  Head  is normocephalic.  EYES: Extraocular movements are intact.  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  MUCOSA:  Nose and oral mucosa within normal limits.  NECK:  Supple and cylindrical without jugular venous distention,  thyromegaly, or tracheal deviation.  There is no cervical adenopathy, and  there are no bruits auscultated.  CHEST: Clear both anteriorly and posteriorly to auscultation.  HEART:  Regular rhythm.  CHEST:  Breasts and axillae are without masses.  ABDOMEN:  The patient has some tenderness in the epigastrium, right upper  quadrant, but no masses are palpated and no rebound tenderness.  Bowel  sounds are present.  The patient had an infraumbilical incision without  hernia.  RECTAL:  Examination is guaiac-negative stool.  EXTREMITIES:  Grossly within normal limits.   REVIEW OF SYSTEMS:  GI:  As stated above, recurrent episodes of right upper  quadrant pain  with nausea and vomiting.  Some constipation.  The patient  states that she has had problems with hemorrhoids in the past.  There is no  unexplained weight loss, although she thinks she may have lost a few pounds  in the last couple of weeks.  No past history of hepatitis.  She has been  treated for GERD and peptic ulcer disease; however, these have never been  diagnosed by upper GI endoscopy.  GU SYSTEM: No history of dysuria or  nephrolithiasis.  ENDOCRINE SYSTEM:  No history of diabetes or thyroid  disease.  OB-GYN HISTORY:  The patient is a gravida 3, para 3, abortus 0,  cesarean 0 female.  She has had D&Cs in the past and has had a total  abdominal hysterectomy.  She retains her ovaries.  No past history of breast  carcinoma in her family.  MUSCULOSKELETAL SYSTEM:  The patient is obese.   MEDICATIONS:  The patient takes Pepcid and over-the-counter medication  for  GERD.   She smokes a pack of cigarettes per day.   ALLERGIES:  No known allergies.   PAST SURGICAL HISTORY:  1.  Right eye surgery.  2.  Total abdominal hysterectomy.  3.  D&Cs.   LABORATORY DATA:  The patient has a white count of 5.1 with hemoglobin 13.7,  hematocrit 39.4 with 46 neutrophils noted. Her bilirubin was 1.6, 1.7, 1.6.  Direct was 1 and indirect 0.7.  SGOT is elevated at 658. SGPT is elevated at  599.  Alkaline phosphatase elevated at 139, and lipase is not elevated, 24  units.   CT scan as mentioned above.   Sonogram was ordered by myself.  This shows some thickening of her  gallbladder and dilatation of her common bile duct which was not appreciated  really on CT scan.  There is a question of a filling defect which suggests  the presence of a common bile duct stone.   IMPRESSION:  1.  Acute cholecystitis with possible choledocholithiasis.  2.  History of sleep apnea and history of gastroesophageal reflux disease.   PLAN:  This patient will undergo cholecystectomy, either laparoscopically or  conventional open cholecystectomy, as explained in great detail with her.  All questions were answered, and risks were explained.  My feeling is that  she may need to have common bile duct explored preoperatively as she has  elevated liver function studies, elevated  bilirubin.  She may, in fact, have passed a common bile duct stone.  We will  certainly repeat these studies in the morning.  If they are normal, we may  not need to have an ERCP.  At any rate, I would appreciate Dr. Luvenia Starch input  with this and have taken the liberty of consulting him.      Barbaraann Barthel, M.D.  Electronically Signed     WB/MEDQ  D:  01/11/2005  T:  01/11/2005  Job:  [id number]   cc:   Angus G. Renard Matter, MD  [address]  [place]  [place] [address]  Fax: [phone]   R. Roetta Sessions, M.D.  [address]  [place] [address]

## 2010-10-28 NOTE — Procedures (Signed)
NAME:  Chloe [name], Chloe [name]                       ACCOUNT NO.:  [id number]   MEDICAL RECORD NO.:  [id number]                   PATIENT TYPE:  OUT   LOCATION:  RAD                                  FACILITY:  [facility]   PHYSICIAN:  Dani Gobble, MD                    DATE OF BIRTH:  05/25/1976   DATE OF PROCEDURE:  06/19/2003  DATE OF DISCHARGE:                                  ECHOCARDIOGRAM   INDICATION:  Ms. Truitt Leep is a 35 year old female who was found to have  cardiomegaly on a chest x-ray who has been experiencing edema and syncope.   Technical quality of the study is somewhat limited secondary to patient body  habitus and poor acoustic windows.   The aorta appeared to be within normal limits at 2.9 cm.   Subjectively, the left atrium also appeared to be within normal limits.  No  obvious clots or masses were appreciated and the patient appeared to be in  sinus rhythm during this procedure.   The intraventricular septum and posterior wall were mildly thickened at 1.3  cm and 1.2 cm, respectively.  The aortic valve was not well visualized but  appeared to be grossly essentially normal.  No aortic insufficiency was  noted.  Doppler interrogation of the aortic valve was within normal limits.   The mitral valve also appeared structurally normal.  No mitral valve  prolapse was noted.  Trivial mitral regurgitation was noted.  Doppler  interrogation of the mitral valve was within normal limits.   The pulmonic valve was not well visualized.   The tricuspid valve appeared grossly structurally normal with trivial  tricuspid regurgitation noted.   The left ventricle appeared grossly normal in size with normal left  ventricular systolic function.  No obvious regional wall motion  abnormalities are noted.  However, subtle regional wall abnormalities would  likely not be appreciated on this study.   The right atrium and right ventricle are both mildly dilated, but right  ventricular  systolic function appeared to be normal.   IMPRESSION:  1. Mild concentric LVH.  2. Trivial mitral and tricuspid regurgitation.  3. Normal LV size and overall systolic function.  4. No obvious regional wall motion abnormalities were noted, however, this     study is not adequate for assessment     of subtle regional wall motion abnormalities.  5. The right atrium and right ventricle were mild to moderately dilated with     normal right ventricular systolic function.      ___________________________________________                                            Dani Gobble, MD   AB/MEDQ  D:  06/19/2003  T:  06/19/2003  Job:  [id number]   cc:  Angus G. Renard Matter, M.D.  [address]  [place]  [place] [address]  Fax: [phone]

## 2010-10-28 NOTE — Group Therapy Note (Signed)
NAMEESTEFANIA, [name]             ACCOUNT NO.:  [id number]   MEDICAL RECORD NO.:  [id number]           PATIENT TYPE:  INP   LOCATION:  A326                          FACILITY:  [facility]   PHYSICIAN:  Angus G. Renard Matter, MD   DATE OF BIRTH:  11/18/1975   DATE OF PROCEDURE:  01/12/2005  DATE OF DISCHARGE:                                   PROGRESS NOTE   SUBJECTIVE:  This patient was admitted with upper abdominal pain.  The  patient did have a CT which showed multiple tiny gallstones with dilated  bile ducts.  She was seen by gastroenterology service as well as surgical  service.  Ultrasound was done with findings compatible with acute  cholecystitis, dilatation of common bile duct, intraductal stone noted.   OBJECTIVE:  VITAL SIGNS:  Blood pressure 109/66, respirations 20, pulse 73,  temperature 98.  Liver enzymes slightly elevated.  LUNGS:  Clear.  HEART:  Regular rhythm.  ABDOMEN:  Slight tenderness in the upper abdomen.   ASSESSMENT:  The patient has gallbladder disease, cholecystitis,  cholelithiasis.   PLAN:  Continue current regimen.       AGM/MEDQ  D:  01/12/2005  T:  01/12/2005  Job:  [id number]

## 2011-01-08 ENCOUNTER — Encounter: Payer: Self-pay | Admitting: *Deleted

## 2011-01-08 ENCOUNTER — Emergency Department (HOSPITAL_COMMUNITY)
Admission: EM | Admit: 2011-01-08 | Discharge: 2011-01-08 | Discharge status: Against Medical Advice | Payer: Medicaid Other | Attending: Emergency Medicine | Admitting: Emergency Medicine

## 2011-01-08 DIAGNOSIS — R509 Fever, unspecified: Secondary | ICD-10-CM | POA: Insufficient documentation

## 2011-01-08 HISTORY — DX: Essential (primary) hypertension: I10

## 2011-01-29 ENCOUNTER — Encounter (HOSPITAL_COMMUNITY): Payer: Self-pay | Admitting: *Deleted

## 2011-01-29 ENCOUNTER — Emergency Department (HOSPITAL_COMMUNITY)
Admission: EM | Admit: 2011-01-29 | Discharge: 2011-01-29 | Disposition: A | Discharge status: Home / Self Care | Payer: Medicaid Other | Attending: Emergency Medicine | Admitting: Emergency Medicine

## 2011-01-29 DIAGNOSIS — R109 Unspecified abdominal pain: Secondary | ICD-10-CM | POA: Insufficient documentation

## 2011-01-29 DIAGNOSIS — N200 Calculus of kidney: Secondary | ICD-10-CM

## 2011-01-29 DIAGNOSIS — R112 Nausea with vomiting, unspecified: Secondary | ICD-10-CM | POA: Insufficient documentation

## 2011-01-29 DIAGNOSIS — R300 Dysuria: Secondary | ICD-10-CM | POA: Insufficient documentation

## 2011-01-29 DIAGNOSIS — I10 Essential (primary) hypertension: Secondary | ICD-10-CM | POA: Insufficient documentation

## 2011-01-29 DIAGNOSIS — F17200 Nicotine dependence, unspecified, uncomplicated: Secondary | ICD-10-CM | POA: Insufficient documentation

## 2011-01-29 LAB — BASIC METABOLIC PANEL
Calcium: 8.9 mg/dL (ref 8.4–10.5)
GFR calc non Af Amer: >60 mL/min (ref >60)
Sodium: 133 mEq/L — ABNORMAL LOW (ref 135–145)

## 2011-01-29 LAB — URINE MICROSCOPIC-ADD ON

## 2011-01-29 LAB — URINALYSIS, ROUTINE W REFLEX MICROSCOPIC
Bilirubin Urine: NEGATIVE
Glucose, UA: NEGATIVE mg/dL
Protein, ur: NEGATIVE mg/dL
Specific Gravity, Urine: 1.015 (ref 1.005–1.030)

## 2011-01-29 MED ORDER — ONDANSETRON HCL 4 MG/2ML IJ SOLN
4.0000 mg | Freq: Once | INTRAMUSCULAR | Status: AC
  Administered 2011-01-29: 4 mg via INTRAVENOUS
  Filled 2011-01-29: qty 2

## 2011-01-29 MED ORDER — SODIUM CHLORIDE 0.9 % IV BOLUS (SEPSIS)
1000.0000 mL | Freq: Once | INTRAVENOUS | Status: AC
  Administered 2011-01-29: 1000 mL via INTRAVENOUS

## 2011-01-29 MED ORDER — IBUPROFEN 800 MG PO TABS: 800.0000 mg | ORAL_TABLET | Freq: Three times a day (TID) | ORAL | Status: AC

## 2011-01-29 MED ORDER — TAMSULOSIN HCL 0.4 MG PO CAPS: 0.4000 mg | ORAL_CAPSULE | Freq: Every day | ORAL | Status: DC

## 2011-01-29 MED ORDER — MORPHINE SULFATE 4 MG/ML IJ SOLN
4.0000 mg | Freq: Once | INTRAMUSCULAR | Status: AC
  Administered 2011-01-29: 4 mg via INTRAVENOUS
  Filled 2011-01-29: qty 1

## 2011-01-29 MED ORDER — KETOROLAC TROMETHAMINE 30 MG/ML IJ SOLN
30.0000 mg | Freq: Once | INTRAMUSCULAR | Status: AC
Start: 2011-01-29 — End: 2011-01-29
  Administered 2011-01-29: 30 mg via INTRAVENOUS
  Filled 2011-01-29: qty 1

## 2011-01-29 NOTE — ED Notes (Signed)
Pt c/o pain to vaginal area burning with urination and lower back pain. Pt states pain started this am.

## 2011-01-29 NOTE — ED Provider Notes (Signed)
History     CSN: [id number] Arrival date & time: 01/29/2011 12:54 PM  Chief Complaint  Patient presents with  . Urinary Retention  . Flank Pain   HPI Comments: Patient has a history of kidney stones but has never required surgical intervention. She says her pain is similar to this. Patient indicates pain in the left flank that she says is an 8/10. She denies hematuria. She denies any other abdominal pain or abdominal symptoms. She has had nausea and one episode of vomiting. Denies any vaginal discharge.  Patient is a 35 y.o. female presenting with flank pain. The history is provided by the patient. No language interpreter was used.  Flank Pain This is a new problem. The current episode started 6 to 12 hours ago. The problem occurs constantly. The problem has been gradually worsening. The symptoms are aggravated by nothing. The symptoms are relieved by nothing.    Past Medical History  Diagnosis Date  . Hypertension     Past Surgical History  Procedure Date  . Cholecystectomy   . Abdominal hysterectomy     Family History  Problem Relation Age of Onset  . Cancer Father     History  Substance Use Topics  . Smoking status: Current Everyday Smoker -- 1.0 packs/day for 15 years    Types: Cigarettes  . Smokeless tobacco: Not on file  . Alcohol Use: No    OB History    Grav Para Term Preterm Abortions TAB SAB Ect Mult Living                  Review of Systems  Constitutional: Negative.   HENT: Negative.   Eyes: Negative.   Respiratory: Negative.   Cardiovascular: Negative.   Gastrointestinal: Positive for nausea and vomiting.  Genitourinary: Positive for dysuria and flank pain.  Musculoskeletal: Negative.   Skin: Negative.   Neurological: Negative.   Hematological: Negative.   Psychiatric/Behavioral: Negative.   All other systems reviewed and are negative.    Physical Exam  BP 105/68  Pulse 78  Temp(Src) 98.2 F (36.8 C) (Oral)  Resp 18  Ht 5\' 5"  (1.651 m)   Wt 245 lb (111.131 kg)  BMI 40.77 kg/m2  SpO2 99%  Physical Exam  Nursing note and vitals reviewed. Constitutional: She is oriented to person, place, and time. She appears well-developed and well-nourished. No distress.       Uncomfortable  HENT:  Head: Normocephalic and atraumatic.  Eyes: Conjunctivae and EOM are normal. Pupils are equal, round, and reactive to light.  Neck: Normal range of motion.  Cardiovascular: Normal rate, regular rhythm, normal heart sounds and intact distal pulses.  Exam reveals no gallop and no friction rub.   No murmur heard. Pulmonary/Chest: Breath sounds normal. No respiratory distress. She has no wheezes. She has no rales. She exhibits no tenderness.  Abdominal: Soft. Bowel sounds are normal. She exhibits no distension. There is no tenderness. There is no rebound and no guarding.  Genitourinary:       Left flank pain  Musculoskeletal: Normal range of motion. She exhibits no edema and no tenderness.  Neurological: She is alert and oriented to person, place, and time. No cranial nerve deficit. She exhibits normal muscle tone. Coordination normal.  Skin: Skin is warm and dry. No rash noted. No erythema. No pallor.  Psychiatric: She has a normal mood and affect. Her behavior is normal.    ED Course  Procedures  MDM Patient was evaluated by myself and felt that  her pain was consistent with her prior kidney stones. Patient had normal kidney function with normal creatinine. Urinalysis was contaminated. Patient was treated with fluids and symptomatic therapy. Patient felt better following this. Patient had not had nausea, vomiting, or fevers. She not had history of UTIs and felt that her pain was consistent with her prior kidney stones. On repeat review of the patient's urinalysis following discharge I considered treating the patient with antibiotic. I called the number listed for the patient which was disconnected. Should patient represent a repeat urinalysis  should be performed. I do think it's possible that the patient's elevated white blood cell count in her urine is secondary to irritation from her stones. However, haven't been able to reach the patient I would have called in a prescription for Keflex 500 mg by mouth 4 times a day x10 days. Patient was discharged home in good condition with prescriptions for ibuprofen and Flomax. She was told to increase her fluid intake.  Assessment: A 35-year-old female who presents today complaining of pain similar to prior kidney stones.  Plan: patient was discharged home in good condition with prescriptions as mentioned above. If patient were to return I would have her repeat a urinalysis to determine if antibiotics are necessary. I attempted to contact the patient after her visit to see how she was doing. I was unable to do so. Had been successful I would've called in a prescription for antibiotic for the patient.      Cyndra Numbers, MD 01/29/11 [phone]

## 2011-01-29 NOTE — ED Notes (Signed)
Patient with no complaints at this time. Respirations even and unlabored. Skin warm/dry. Discharge instructions reviewed with patient at this time. Patient given opportunity to voice concerns/ask questions. IV removed per policy and band-aid applied to site. Patient discharged at this time and left Emergency Department with steady gait.  

## 2011-01-29 NOTE — ED Notes (Signed)
Lower back pain, bilateral flank pain, painful urination that started this am, pt states that it feels like her previous kidney stones that she has had in the past,

## 2011-02-21 ENCOUNTER — Encounter (HOSPITAL_COMMUNITY)
Admission: RE | Admit: 2011-02-21 | Discharge: 2011-02-21 | Disposition: A | Discharge status: Home / Self Care | Payer: Medicaid Other | Source: Ambulatory Visit | Attending: Orthopedic Surgery | Admitting: Orthopedic Surgery

## 2011-02-21 ENCOUNTER — Other Ambulatory Visit (HOSPITAL_COMMUNITY): Payer: Self-pay | Admitting: Orthopedic Surgery

## 2011-02-21 DIAGNOSIS — M47812 Spondylosis without myelopathy or radiculopathy, cervical region: Secondary | ICD-10-CM

## 2011-02-21 LAB — SURGICAL PCR SCREEN
MRSA, PCR: NEGATIVE
Staphylococcus aureus: NEGATIVE

## 2011-02-21 LAB — CBC
Platelets: 180 10*3/uL (ref 150–400)
RBC: 4.55 MIL/uL (ref 3.87–5.11)
WBC: 7.3 10*3/uL (ref 4.0–10.5)

## 2011-02-23 ENCOUNTER — Ambulatory Visit (HOSPITAL_COMMUNITY): Payer: Medicaid Other

## 2011-02-23 ENCOUNTER — Ambulatory Visit (HOSPITAL_COMMUNITY)
Admission: RE | Admit: 2011-02-23 | Discharge: 2011-02-24 | Disposition: A | Discharge status: Home / Self Care | Payer: Medicaid Other | Source: Ambulatory Visit | Attending: Orthopedic Surgery | Admitting: Orthopedic Surgery

## 2011-02-23 DIAGNOSIS — Z01810 Encounter for preprocedural cardiovascular examination: Secondary | ICD-10-CM | POA: Insufficient documentation

## 2011-02-23 DIAGNOSIS — M5020 Other cervical disc displacement, unspecified cervical region: Secondary | ICD-10-CM | POA: Insufficient documentation

## 2011-02-23 DIAGNOSIS — Z23 Encounter for immunization: Secondary | ICD-10-CM | POA: Insufficient documentation

## 2011-02-23 DIAGNOSIS — F17200 Nicotine dependence, unspecified, uncomplicated: Secondary | ICD-10-CM | POA: Insufficient documentation

## 2011-02-23 DIAGNOSIS — Z01812 Encounter for preprocedural laboratory examination: Secondary | ICD-10-CM | POA: Insufficient documentation

## 2011-02-23 DIAGNOSIS — Z01818 Encounter for other preprocedural examination: Secondary | ICD-10-CM | POA: Insufficient documentation

## 2011-02-23 DIAGNOSIS — G4733 Obstructive sleep apnea (adult) (pediatric): Secondary | ICD-10-CM | POA: Insufficient documentation

## 2011-02-23 DIAGNOSIS — Z01811 Encounter for preprocedural respiratory examination: Secondary | ICD-10-CM | POA: Insufficient documentation

## 2011-02-23 DIAGNOSIS — E669 Obesity, unspecified: Secondary | ICD-10-CM | POA: Insufficient documentation

## 2011-02-26 ENCOUNTER — Emergency Department (HOSPITAL_COMMUNITY)
Admission: EM | Admit: 2011-02-26 | Discharge: 2011-02-26 | Discharge status: Against Medical Advice | Payer: Medicaid Other | Attending: Emergency Medicine | Admitting: Emergency Medicine

## 2011-02-26 ENCOUNTER — Encounter (HOSPITAL_COMMUNITY): Payer: Self-pay | Admitting: *Deleted

## 2011-02-26 DIAGNOSIS — R51 Headache: Secondary | ICD-10-CM | POA: Insufficient documentation

## 2011-02-26 DIAGNOSIS — Z5320 Procedure and treatment not carried out because of patient's decision for unspecified reasons: Secondary | ICD-10-CM | POA: Insufficient documentation

## 2011-02-26 NOTE — ED Notes (Signed)
Pt's family came out at desk stating pt was ready to leave, stating that her headache had gotten better; I informed pt if she did get any worse to come back; pt signed AMA

## 2011-02-26 NOTE — ED Notes (Signed)
Patient here for c/o HA and low grade fever, body aches and nausea; recent neck surgery 3 days ago (neck fusion)

## 2011-02-26 NOTE — ED Provider Notes (Signed)
Patient left prior to my evaluation.  Dayton Bailiff, MD 02/26/11 2220

## 2011-03-07 NOTE — Op Note (Signed)
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  OPERATIVE PROCEDURE:  Anterior cervical diskectomy and fusion C6-c7.  FIRST ASSISTANT:  Norval Gable, my PA.  INSTRUMENTATION SYSTEM USED:  Titan titanium intervertebral graft, 7-mm high lordotic, large, packed with DBX Mix with a 40-mm contoured anterior cervical Synthes Vector plate with 16-XW screws into the body of C6, 40-mm screws into the body of C7.  HISTORY:  This is a very pleasant 35 year old woman who has been having progressive neck and intermittent arm pain for sometime.  Attempts at conservative management failed to alleviate her symptoms, but she did get temporary relief with selective nerve root blocks.  After discussing treatment options, she elected to proceed with surgery.  All appropriate risks, benefits, and alternatives were discussed with the patient. Consent was obtained.  OPERATIVE NOTE:  The patient was brought to the operating room, placed supine on the operating table.  After successful induction of general anesthesia and endotracheal intubation, TEDs, SCDs were applied.  The patient was placed supine on the Stryker table.  Rolled towels were placed to the shoulders.  Shoulders were taped down.  Anterior cervical spine was prepped and draped in standard fashion.  Appropriate time-out was done to confirm patient, procedure, and all other pertinent important data.  Once this was done, a transverse incision was made on the left side of the spine at  the level of the C6-C7 disk space.  Sharp dissection was carried out down to the platysma.  Platysma was sharply incised and I identified the medial border of sternocleidomastoid.  I continued my sharp dissection along the medial border and then I bluntly dissected down the prevertebral fascia to the anterior longitudinal ligament.  I protected the carotid sheath laterally with a finger, swept the esophagus medially, and placed a retractor.  At this point, I could see the anterior aspect of the spine.  I mobilized the remaining prevertebral fascia, placed a needle into the disk space, and confirmed I was at the C6-C7 disk space.  Once I had confirmed this, I mobilized the longus coli muscles out to the level of the uncovertebral joint from the midbody of C6 to the midbody of C7.  Caspar retractor blades were placed underneath the longus coli muscles and they were expanded appropriately.  Endotracheal cuff was deflated and inflated to reset the pressure.  Distraction pins were placed into the bodies of C6 and C7 and distracted the disk space.  Annulotomy was performed with a 15 blade scalpel.  Then using a combination of pituitary rongeurs, curettes, and Kerrison rongeurs, I removed all the disk material.  There was a fragmented disk material central into the right which I was able to remove using  a combination of micro pituitary rongeurs and a micro nerve hook.  I exploited this defect to remove the posterior longitudinal ligament with a 1-mm Kerrison.  At this point, I removed the posterior osteophytes from the uncovertebral joint and rasped the endplates.  At this point, I had an adequate decompression.  I trialed a 7 large and 6 large interbody graft, but the 7 large gave good fixation.  I then obtained this, packed it, irrigated the wound copiously with normal saline, made sure I had bleeding subchondral bone and then packed it to graft to its appropriate position and then removed the  distraction pins, contoured the plate, and placed it on the operative spine and fixed it with 40-mm drills and screws into the body of C7, 60-mm screws into the body of C6.  At this point, I removed the remaining retractors, made sure the screws were all torqued down, irrigated copiously with normal saline, obtained hemostasis with bipolar electrocautery.  I checked to ensure the esophagus was not inadvertently entrapped beneath the plate which it was not.  I returned the trachea and esophagus to midline, closed the platysma with interrupted 2-0 Vicryl sutures and 3-0 Monocryl for the skin.  My first assistant, Norval Gable was instrumental and assisting in retraction and protection of the vital structures which are the esophagus and trachea.  She also assisted with removing disk fragments as well as with the closure.     Alvy Beal, MD     DDB/MEDQ  D:  02/23/2011  T:  02/23/2011  Job:  [id number]  Electronically Signed by Venita Lick MD on 03/07/2011 08:45:56 AM

## 2011-03-23 LAB — POCT CARDIAC MARKERS
CKMB, poc: <1 — ABNORMAL LOW
Myoglobin, poc: 27.6

## 2011-03-23 LAB — CBC
Hemoglobin: 14
RBC: 4.45
RDW: 12.5

## 2011-03-23 LAB — DIFFERENTIAL
Basophils Absolute: 0.1
Lymphocytes Relative: 34
Monocytes Absolute: 0.5
Monocytes Relative: 4
Neutro Abs: 5.6

## 2011-03-23 LAB — BASIC METABOLIC PANEL
Calcium: 8.8
GFR calc Af Amer: >60
GFR calc non Af Amer: >60
Sodium: 140

## 2012-01-31 ENCOUNTER — Emergency Department (HOSPITAL_COMMUNITY)
Admission: EM | Admit: 2012-01-31 | Discharge: 2012-01-31 | Disposition: A | Discharge status: Home / Self Care | Payer: Self-pay | Attending: Emergency Medicine | Admitting: Emergency Medicine

## 2012-01-31 ENCOUNTER — Encounter (HOSPITAL_COMMUNITY): Payer: Self-pay | Admitting: *Deleted

## 2012-01-31 ENCOUNTER — Emergency Department (HOSPITAL_COMMUNITY): Payer: Self-pay

## 2012-01-31 DIAGNOSIS — M62830 Muscle spasm of back: Secondary | ICD-10-CM

## 2012-01-31 DIAGNOSIS — M5380 Other specified dorsopathies, site unspecified: Secondary | ICD-10-CM | POA: Insufficient documentation

## 2012-01-31 DIAGNOSIS — Z809 Family history of malignant neoplasm, unspecified: Secondary | ICD-10-CM | POA: Insufficient documentation

## 2012-01-31 DIAGNOSIS — F17200 Nicotine dependence, unspecified, uncomplicated: Secondary | ICD-10-CM | POA: Insufficient documentation

## 2012-01-31 DIAGNOSIS — R202 Paresthesia of skin: Secondary | ICD-10-CM

## 2012-01-31 DIAGNOSIS — R209 Unspecified disturbances of skin sensation: Secondary | ICD-10-CM | POA: Insufficient documentation

## 2012-01-31 DIAGNOSIS — I10 Essential (primary) hypertension: Secondary | ICD-10-CM | POA: Insufficient documentation

## 2012-01-31 MED ORDER — ONDANSETRON 8 MG PO TBDP
8.0000 mg | ORAL_TABLET | Freq: Once | ORAL | Status: AC
  Administered 2012-01-31: 8 mg via ORAL
  Filled 2012-01-31: qty 1

## 2012-01-31 MED ORDER — KETOROLAC TROMETHAMINE 60 MG/2ML IM SOLN
60.0000 mg | Freq: Once | INTRAMUSCULAR | Status: AC
  Administered 2012-01-31: 60 mg via INTRAMUSCULAR
  Filled 2012-01-31: qty 2

## 2012-01-31 MED ORDER — METHOCARBAMOL 500 MG PO TABS: 1000.0000 mg | ORAL_TABLET | Freq: Four times a day (QID) | ORAL | Status: AC

## 2012-01-31 MED ORDER — IBUPROFEN 600 MG PO TABS: 600.0000 mg | ORAL_TABLET | Freq: Four times a day (QID) | ORAL | Status: AC | PRN

## 2012-01-31 NOTE — ED Notes (Signed)
Pt c/o pain in her neck radiating down her right shoulder and arm. Pt also c/o nausea. Denies injury. Has had neck fusion done 1 year ago.

## 2012-02-04 NOTE — ED Provider Notes (Signed)
History     CSN: [id number]  Arrival date & time 01/31/12  1212   First MD Initiated Contact with Patient 01/31/12 1226      Chief Complaint  Patient presents with  . Neck Pain    (Consider location/radiation/quality/duration/timing/severity/associated sxs/prior treatment) HPI Comments: Chloe Fahs Leyvapresents with acute on chronic neck pain which has which has been present for the past week.   Patient denies any new injury specifically.  There is radiation into her right shoulder, her right upper back and upper arm which is described as numbness.  There has been no weakness in her extremities and no urinary or bowel retention or incontinence.  Patient does not have a history of cancer or IVDU.  Pain is constant and sharp,  Worse with ROM of her neck.  She has a cervical fusion of her neck completed last year.  Her surgeon, Dr. Shon Baton is not aware her symptoms have worsened.  She also reports nausea which gets worse when her pain is worsened.  She denies chest pain and denies sob.   The history is provided by the patient.    Past Medical History  Diagnosis Date  . Hypertension     Past Surgical History  Procedure Date  . Cholecystectomy   . Abdominal hysterectomy   . Neck surgery     Family History  Problem Relation Age of Onset  . Cancer Father     History  Substance Use Topics  . Smoking status: Current Everyday Smoker -- 1.0 packs/day for 15 years    Types: Cigarettes  . Smokeless tobacco: Not on file  . Alcohol Use: No    OB History    Grav Para Term Preterm Abortions TAB SAB Ect Mult Living                  Review of Systems  Constitutional: Negative for fever.  Respiratory: Negative for shortness of breath.   Cardiovascular: Negative for chest pain and leg swelling.  Gastrointestinal: Negative for abdominal pain, constipation and abdominal distention.  Genitourinary: Negative for dysuria, urgency, frequency, flank pain and difficulty urinating.    Musculoskeletal: Positive for back pain. Negative for joint swelling and gait problem.  Skin: Negative for rash.  Neurological: Positive for numbness. Negative for weakness.    Allergies  Review of patient's allergies indicates no known allergies.  Home Medications   Current Outpatient Rx  Name Route Sig Dispense Refill  . IBUPROFEN 600 MG PO TABS Oral Take 1 tablet (600 mg total) by mouth every 6 (six) hours as needed for pain. 20 tablet 0  . METHOCARBAMOL 500 MG PO TABS Oral Take 2 tablets (1,000 mg total) by mouth 4 (four) times daily. 40 tablet 0    BP 123/69  Pulse 68  Temp 98.4 F (36.9 C) (Oral)  Resp 20  Ht 5\' 4"  (1.626 m)  Wt 236 lb (107.049 kg)  BMI 40.51 kg/m2  SpO2 99%  Physical Exam  Nursing note and vitals reviewed. Constitutional: She appears well-developed and well-nourished.  HENT:  Head: Normocephalic.  Eyes: Conjunctivae are normal.  Neck: Normal range of motion. Neck supple.  Cardiovascular: Normal rate and intact distal pulses.        Radial pulses equal.  Pulmonary/Chest: Effort normal.  Abdominal: Soft. Bowel sounds are normal. She exhibits no distension and no mass.  Musculoskeletal: Normal range of motion. She exhibits no edema.       Cervical back: She exhibits tenderness, pain and spasm. She exhibits  no swelling, no edema and normal pulse.       Back:       TTP midline lower c spine with pain also along right scapula and across trapezius muscle into right shoulder.  Neurological: She is alert. She has normal strength. She displays no atrophy, no tremor and normal reflexes. No sensory deficit. Coordination and gait normal.  Reflex Scores:      Bicep reflexes are 2+ on the right side and 2+ on the left side.      No strength deficit noted in upper extremity flexor and extensor muscle groups. Equal grip strength.  Skin: Skin is warm and dry.  Psychiatric: She has a normal mood and affect.    ED Course  Procedures (including critical care  time)  Labs Reviewed - No data to display No results found.   1. Muscle spasm of back   2. Numbness and tingling of right arm       MDM  Ibuprofen, robaxin.  Ice/heat.  Call for recheck by Dr. Shon Baton.  No neuro deficit on exam or by history to suggest emergent or surgical presentation.  Also discussed worsened sx that should prompt immediate re-evaluation including distal weakness.            Burgess Amor, [place] 02/04/12 2122

## 2012-02-04 NOTE — ED Provider Notes (Signed)
Medical screening examination/treatment/procedure(s) were performed by non-physician practitioner and as supervising physician I was immediately available for consultation/collaboration.   Charles B. Sheldon, MD 02/04/12 2258 

## 2012-05-01 ENCOUNTER — Emergency Department (HOSPITAL_COMMUNITY): Payer: Self-pay

## 2012-05-01 ENCOUNTER — Encounter (HOSPITAL_COMMUNITY): Payer: Self-pay | Admitting: *Deleted

## 2012-05-01 ENCOUNTER — Emergency Department (HOSPITAL_COMMUNITY)
Admission: EM | Admit: 2012-05-01 | Discharge: 2012-05-01 | Disposition: A | Discharge status: Home / Self Care | Payer: Self-pay | Attending: Emergency Medicine | Admitting: Emergency Medicine

## 2012-05-01 DIAGNOSIS — S92919A Unspecified fracture of unspecified toe(s), initial encounter for closed fracture: Secondary | ICD-10-CM | POA: Insufficient documentation

## 2012-05-01 DIAGNOSIS — X58XXXA Exposure to other specified factors, initial encounter: Secondary | ICD-10-CM | POA: Insufficient documentation

## 2012-05-01 DIAGNOSIS — I10 Essential (primary) hypertension: Secondary | ICD-10-CM | POA: Insufficient documentation

## 2012-05-01 DIAGNOSIS — Y939 Activity, unspecified: Secondary | ICD-10-CM | POA: Insufficient documentation

## 2012-05-01 DIAGNOSIS — Y929 Unspecified place or not applicable: Secondary | ICD-10-CM | POA: Insufficient documentation

## 2012-05-01 DIAGNOSIS — F17200 Nicotine dependence, unspecified, uncomplicated: Secondary | ICD-10-CM | POA: Insufficient documentation

## 2012-05-01 MED ORDER — HYDROCODONE-ACETAMINOPHEN 5-325 MG PO TABS: 1.0000 | ORAL_TABLET | Freq: Four times a day (QID) | ORAL | Status: AC | PRN

## 2012-05-01 MED ORDER — IBUPROFEN 800 MG PO TABS
800.0000 mg | ORAL_TABLET | Freq: Once | ORAL | Status: AC
  Administered 2012-05-01: 800 mg via ORAL
  Filled 2012-05-01: qty 1

## 2012-05-01 NOTE — ED Provider Notes (Signed)
History     CSN: [id number]  Arrival date & time 05/01/12  1533   First MD Initiated Contact with Patient 05/01/12 1612      Chief Complaint  Patient presents with  . Foot Pain    (Consider location/radiation/quality/duration/timing/severity/associated sxs/prior treatment) Patient is a 36 y.o. female presenting with lower extremity pain. The history is provided by the patient. No language interpreter was used.  Foot Pain This is a new problem. The current episode started today. The problem occurs constantly. The problem has been unchanged. The symptoms are aggravated by standing and walking (palpation and movement.). She has tried nothing for the symptoms.    Past Medical History  Diagnosis Date  . Hypertension     Past Surgical History  Procedure Date  . Cholecystectomy   . Abdominal hysterectomy   . Neck surgery   . Eye surgery     Family History  Problem Relation Age of Onset  . Cancer Father     History  Substance Use Topics  . Smoking status: Current Every Day Smoker -- 1.0 packs/day for 15 years    Types: Cigarettes  . Smokeless tobacco: Not on file  . Alcohol Use: No    OB History    Grav Para Term Preterm Abortions TAB SAB Ect Mult Living                  Review of Systems  Musculoskeletal:       Toe injury.   Skin: Negative for wound.    Allergies  Review of patient's allergies indicates no known allergies.  Home Medications   Current Outpatient Rx  Name  Route  Sig  Dispense  Refill  . HYDROCODONE-ACETAMINOPHEN 5-325 MG PO TABS   Oral   Take 1 tablet by mouth every 6 (six) hours as needed for pain.   20 tablet   0     BP 107/67  Pulse 78  Temp 98.1 F (36.7 C) (Oral)  Resp 20  Ht 5\' 4"  (1.626 m)  Wt 248 lb (112.492 kg)  BMI 42.57 kg/m2  SpO2 100%  Physical Exam  Nursing note and vitals reviewed. Constitutional: She is oriented to person, place, and time. She appears well-developed and well-nourished. No distress.  HENT:    Head: Normocephalic and atraumatic.  Eyes: EOM are normal.  Neck: Normal range of motion.  Cardiovascular: Normal rate, regular rhythm and normal heart sounds.   Pulmonary/Chest: Effort normal and breath sounds normal.  Abdominal: Soft. She exhibits no distension. There is no tenderness.  Musculoskeletal: She exhibits tenderness.       Left foot: She exhibits decreased range of motion, tenderness and bony tenderness. She exhibits no swelling, normal capillary refill, no crepitus, no deformity and no laceration.       Feet:  Neurological: She is alert and oriented to person, place, and time.  Skin: Skin is warm and dry.  Psychiatric: She has a normal mood and affect. Judgment normal.    ED Course  Procedures (including critical care time)  Labs Reviewed - No data to display Dg Toe 5th Left  05/01/2012  *RADIOLOGY REPORT*  Clinical Data: Stumped pinky toe today, pain lateral along MTP joint  DG TOE 5TH LEFT  Comparison: 06/30/2007  Findings: The oblique view best shows a longitudinal fracture of the proximal phalanx of the small toe extending from the distal articular surface of the proximal - dorsal metaphysis.  Fracture appears nondisplaced.  IMPRESSION: 1.  Longitudinal nondisplaced fracture the  proximal phalanx of small toe extending from the proximal metaphysis to the distal articular surface.   Original Report Authenticated By: Gaylyn Rong, M.D.      1. Fractured toe       MDM  Buddy tape, post-op shoe, ice, elevation Ibuprofen rx-hydrocodone, 20        Evalina Field, [place] 05/01/12 1629

## 2012-05-01 NOTE — ED Notes (Signed)
R. Miller, PA at bedside  

## 2012-05-01 NOTE — ED Notes (Signed)
Pain lt little toe, struck against a chair leg

## 2012-05-01 NOTE — ED Notes (Signed)
Patient transported to CT 

## 2012-05-01 NOTE — ED Notes (Signed)
Patient transported to X-ray 

## 2012-05-03 NOTE — ED Provider Notes (Signed)
Medical screening examination/treatment/procedure(s) were performed by non-physician practitioner and as supervising physician I was immediately available for consultation/collaboration.  Montie Swiderski T Rigdon Macomber, MD 05/03/12 0918 

## 2013-04-11 ENCOUNTER — Emergency Department (HOSPITAL_COMMUNITY)
Admission: EM | Admit: 2013-04-11 | Discharge: 2013-04-11 | Disposition: A | Discharge status: Home / Self Care | Payer: BC Managed Care – PPO | Attending: Emergency Medicine | Admitting: Emergency Medicine

## 2013-04-11 ENCOUNTER — Emergency Department (HOSPITAL_COMMUNITY): Payer: BC Managed Care – PPO

## 2013-04-11 ENCOUNTER — Encounter (HOSPITAL_COMMUNITY): Payer: Self-pay | Admitting: Emergency Medicine

## 2013-04-11 DIAGNOSIS — F17200 Nicotine dependence, unspecified, uncomplicated: Secondary | ICD-10-CM | POA: Insufficient documentation

## 2013-04-11 DIAGNOSIS — R209 Unspecified disturbances of skin sensation: Secondary | ICD-10-CM | POA: Insufficient documentation

## 2013-04-11 DIAGNOSIS — H538 Other visual disturbances: Secondary | ICD-10-CM | POA: Insufficient documentation

## 2013-04-11 DIAGNOSIS — I10 Essential (primary) hypertension: Secondary | ICD-10-CM | POA: Insufficient documentation

## 2013-04-11 DIAGNOSIS — R51 Headache: Secondary | ICD-10-CM | POA: Insufficient documentation

## 2013-04-11 LAB — URINALYSIS, ROUTINE W REFLEX MICROSCOPIC
Bilirubin Urine: NEGATIVE
Leukocytes, UA: NEGATIVE
Nitrite: NEGATIVE
Specific Gravity, Urine: 1.02 (ref 1.005–1.030)
Urobilinogen, UA: 0.2 mg/dL (ref 0.0–1.0)

## 2013-04-11 LAB — COMPREHENSIVE METABOLIC PANEL
ALT: 25 U/L (ref 0–35)
BUN: 12 mg/dL (ref 6–23)
CO2: 27 mEq/L (ref 19–32)
Calcium: 9.3 mg/dL (ref 8.4–10.5)
Creatinine, Ser: 0.62 mg/dL (ref 0.50–1.10)
GFR calc Af Amer: >90 mL/min (ref >90)
GFR calc non Af Amer: >90 mL/min (ref >90)
Glucose, Bld: 102 mg/dL — ABNORMAL HIGH (ref 70–99)

## 2013-04-11 LAB — CBC WITH DIFFERENTIAL/PLATELET
Eosinophils Absolute: 0.8 10*3/uL — ABNORMAL HIGH (ref 0.0–0.7)
Eosinophils Relative: 8 % — ABNORMAL HIGH (ref 0–5)
HCT: 43.4 % (ref 36.0–46.0)
Lymphocytes Relative: 36 % (ref 12–46)
Lymphs Abs: 3.6 10*3/uL (ref 0.7–4.0)
MCH: 32.5 pg (ref 26.0–34.0)
MCV: 94.8 fL (ref 78.0–100.0)
Monocytes Absolute: 0.6 10*3/uL (ref 0.1–1.0)
Monocytes Relative: 5 % (ref 3–12)
RBC: 4.58 MIL/uL (ref 3.87–5.11)
WBC: 10.1 10*3/uL (ref 4.0–10.5)

## 2013-04-11 MED ORDER — DIPHENHYDRAMINE HCL 50 MG/ML IJ SOLN
25.0000 mg | Freq: Once | INTRAMUSCULAR | Status: AC
  Administered 2013-04-11: 25 mg via INTRAVENOUS
  Filled 2013-04-11: qty 1

## 2013-04-11 MED ORDER — KETOROLAC TROMETHAMINE 30 MG/ML IJ SOLN
30.0000 mg | Freq: Once | INTRAMUSCULAR | Status: AC
  Administered 2013-04-11: 30 mg via INTRAVENOUS
  Filled 2013-04-11: qty 1

## 2013-04-11 MED ORDER — METOCLOPRAMIDE HCL 5 MG/ML IJ SOLN
10.0000 mg | Freq: Once | INTRAMUSCULAR | Status: AC
  Administered 2013-04-11: 10 mg via INTRAVENOUS
  Filled 2013-04-11: qty 2

## 2013-04-11 MED ORDER — TRAMADOL HCL 50 MG PO TABS: 50.0000 mg | ORAL_TABLET | Freq: Three times a day (TID) | ORAL | Status: DC | PRN

## 2013-04-11 NOTE — ED Notes (Signed)
Dizziness, blurred vision, "shaky inside", headache, and intermittent right numbness to right arm. Symptoms began this morning.

## 2013-04-11 NOTE — ED Provider Notes (Signed)
CSN: [id number]     Arrival date & time 04/11/13  1142 History  This chart was scribed for Chloe Lennert, MD by Bennett Scrape, ED Scribe. This patient was seen in room APA07/APA07 and the patient's care was started at 1:12 PM.   CC: Dizziness   Patient is a 37 y.o. female presenting with general illness. The history is provided by the patient. No language interpreter was used.  Illness Location:  Dizziness Quality:  Room spinning Severity:  Mild Onset quality:  Gradual Duration: this morning. Timing:  Intermittent Progression:  Unchanged Chronicity:  New Context:  Possible elevated BP, has h/o HTN Relieved by:  Rest Worsened by:  Standing, changing positions Associated symptoms: headaches   Associated symptoms: no abdominal pain, no chest pain, no congestion, no cough, no diarrhea, no fatigue and no rash    HPI Comments: Chloe [name] is a 37 y.o. female with a h/o HTN who presents to the Emergency Department complaining of dizziness with associated blurred vision, HA and intermittent right numbness to right arm that started this morning. Pt has a h/o HTN and reports similar episodes of the same. Denies nausea, emesis and syncope.    Past Medical History  Diagnosis Date  . Hypertension    Past Surgical History  Procedure Laterality Date  . Cholecystectomy    . Abdominal hysterectomy    . Neck surgery    . Eye surgery     Family History  Problem Relation Age of Onset  . Cancer Father    History  Substance Use Topics  . Smoking status: Current Every Day Smoker -- 1.00 packs/day for 15 years    Types: Cigarettes  . Smokeless tobacco: Not on file  . Alcohol Use: No   No OB history provided.  Review of Systems  Constitutional: Negative for appetite change and fatigue.  HENT: Negative for congestion, ear discharge and sinus pressure.   Eyes: Positive for visual disturbance. Negative for discharge.  Respiratory: Negative for cough.   Cardiovascular: Negative for  chest pain.  Gastrointestinal: Negative for abdominal pain and diarrhea.  Genitourinary: Negative for frequency and hematuria.  Musculoskeletal: Negative for back pain.  Skin: Negative for rash.  Neurological: Positive for dizziness, numbness and headaches. Negative for seizures and weakness.  Psychiatric/Behavioral: Negative for hallucinations.    Allergies  Review of patient's allergies indicates no known allergies.  Home Medications  No current outpatient prescriptions on file.  Triage Vitals: BP 104/75  Pulse 62  Temp(Src) 98.6 F (37 C) (Oral)  Resp 10  Ht 5\' 5"  (1.651 m)  Wt 245 lb (111.131 kg)  BMI 40.77 kg/m2  SpO2 98%  Physical Exam  Nursing note and vitals reviewed. Constitutional: She is oriented to person, place, and time. She appears well-developed and well-nourished.  HENT:  Head: Normocephalic and atraumatic.  Eyes: Conjunctivae and EOM are normal. No scleral icterus.  Neck: Neck supple. No thyromegaly present.  Cardiovascular: Normal rate and regular rhythm.  Exam reveals no gallop and no friction rub.   No murmur heard. Pulmonary/Chest: Effort normal and breath sounds normal. No stridor. She has no wheezes. She has no rales. She exhibits no tenderness.  Abdominal: She exhibits no distension. There is no tenderness. There is no rebound.  Musculoskeletal: Normal range of motion. She exhibits no edema.  Lymphadenopathy:    She has no cervical adenopathy.  Neurological: She is alert and oriented to person, place, and time. She exhibits normal muscle tone. Coordination normal.  Skin: Skin  is warm and dry. No rash noted. No erythema.  Psychiatric: She has a normal mood and affect. Her behavior is normal.    ED Course  Procedures (including critical care time)  Medications  ketorolac (TORADOL) 30 MG/ML injection 30 mg (30 mg Intravenous Given 04/11/13 1327)  metoCLOPramide (REGLAN) injection 10 mg (10 mg Intravenous Given 04/11/13 1327)  diphenhydrAMINE  (BENADRYL) injection 25 mg (25 mg Intravenous Given 04/11/13 1327)    DIAGNOSTIC STUDIES: Oxygen Saturation is 98% on room air, normal by my interpretation.    COORDINATION OF CARE: 1:18 PM-Discussed treatment plan which includes medications, CT of head, CBC, CMP and UA with pt at bedside and pt agreed to plan.   Labs Review Labs Reviewed  CBC WITH DIFFERENTIAL - Abnormal; Notable for the following:    Eosinophils Relative 8 (*)    Eosinophils Absolute 0.8 (*)    All other components within normal limits  COMPREHENSIVE METABOLIC PANEL - Abnormal; Notable for the following:    Glucose, Bld 102 (*)    All other components within normal limits  URINALYSIS, ROUTINE W REFLEX MICROSCOPIC   Imaging Review Ct Head Wo Contrast  04/11/2013   CLINICAL DATA:  Dizziness with blurred vision; headache; intermittent right upper extremity numbness  EXAM: CT HEAD WITHOUT CONTRAST  TECHNIQUE: Contiguous axial images were obtained from the base of the skull through the vertex without intravenous contrast. Study was obtained within 24 hr of patient's arrival at the emergency department.  COMPARISON:  March 04, 2008  FINDINGS: The ventricles are normal in size and configuration. There is no mass, hemorrhage, extra-axial fluid collection, or midline shift. No focal gray-white compartment lesions are identified. There is no demonstrable acute infarct.  Bony calvarium appears intact. The mastoid air cells are clear. There are retention cysts in the left maxillary antrum.  IMPRESSION: There are retention cysts in the left maxillary antrum. No intracranial mass, hemorrhage, or acute appearing infarct.   Electronically Signed   By: Bretta Bang M.D.   On: 04/11/2013 14:24    EKG Interpretation     Ventricular Rate:  65 PR Interval:  172 QRS Duration: 90 QT Interval:  420 QTC Calculation: 436 R Axis:   73 Text Interpretation:  Normal sinus rhythm with sinus arrhythmia Normal ECG When compared with ECG  of 21-Feb-2011 14:41, No significant change was found          pt improved with tx  MDM  No diagnosis found.   The chart was scribed for me under my direct supervision.  I personally performed the history, physical, and medical decision making and all procedures in the evaluation of this patient.Chloe Lennert, MD 04/11/13 [phone]

## 2013-07-29 ENCOUNTER — Emergency Department (HOSPITAL_COMMUNITY): Payer: Self-pay

## 2013-07-29 ENCOUNTER — Emergency Department (HOSPITAL_COMMUNITY)
Admission: EM | Admit: 2013-07-29 | Discharge: 2013-07-29 | Disposition: A | Discharge status: Home / Self Care | Payer: Self-pay | Attending: Emergency Medicine | Admitting: Emergency Medicine

## 2013-07-29 ENCOUNTER — Encounter (HOSPITAL_COMMUNITY): Payer: Self-pay | Admitting: Emergency Medicine

## 2013-07-29 DIAGNOSIS — Z90710 Acquired absence of both cervix and uterus: Secondary | ICD-10-CM | POA: Insufficient documentation

## 2013-07-29 DIAGNOSIS — R05 Cough: Secondary | ICD-10-CM

## 2013-07-29 DIAGNOSIS — Z87442 Personal history of urinary calculi: Secondary | ICD-10-CM | POA: Insufficient documentation

## 2013-07-29 DIAGNOSIS — R509 Fever, unspecified: Secondary | ICD-10-CM | POA: Insufficient documentation

## 2013-07-29 DIAGNOSIS — Z9089 Acquired absence of other organs: Secondary | ICD-10-CM | POA: Insufficient documentation

## 2013-07-29 DIAGNOSIS — R350 Frequency of micturition: Secondary | ICD-10-CM | POA: Insufficient documentation

## 2013-07-29 DIAGNOSIS — J3489 Other specified disorders of nose and nasal sinuses: Secondary | ICD-10-CM | POA: Insufficient documentation

## 2013-07-29 DIAGNOSIS — I10 Essential (primary) hypertension: Secondary | ICD-10-CM | POA: Insufficient documentation

## 2013-07-29 DIAGNOSIS — R0602 Shortness of breath: Secondary | ICD-10-CM | POA: Insufficient documentation

## 2013-07-29 DIAGNOSIS — R109 Unspecified abdominal pain: Secondary | ICD-10-CM | POA: Insufficient documentation

## 2013-07-29 DIAGNOSIS — R059 Cough, unspecified: Secondary | ICD-10-CM | POA: Insufficient documentation

## 2013-07-29 DIAGNOSIS — F17200 Nicotine dependence, unspecified, uncomplicated: Secondary | ICD-10-CM | POA: Insufficient documentation

## 2013-07-29 HISTORY — DX: Calculus of kidney: N20.0

## 2013-07-29 LAB — URINALYSIS, ROUTINE W REFLEX MICROSCOPIC
Bilirubin Urine: NEGATIVE
GLUCOSE, UA: NEGATIVE mg/dL
Hgb urine dipstick: NEGATIVE
KETONES UR: NEGATIVE mg/dL
LEUKOCYTES UA: NEGATIVE
NITRITE: NEGATIVE
PH: 6.5 (ref 5.0–8.0)
Protein, ur: NEGATIVE mg/dL
SPECIFIC GRAVITY, URINE: 1.025 (ref 1.005–1.030)
Urobilinogen, UA: 0.2 mg/dL (ref 0.0–1.0)

## 2013-07-29 MED ORDER — HYDROCOD POLST-CHLORPHEN POLST 10-8 MG/5ML PO LQCR: 5.0000 mL | Freq: Two times a day (BID) | ORAL | Status: DC | PRN

## 2013-07-29 MED ORDER — ONDANSETRON 8 MG PO TBDP
8.0000 mg | ORAL_TABLET | Freq: Once | ORAL | Status: AC
  Administered 2013-07-29: 8 mg via ORAL
  Filled 2013-07-29: qty 1

## 2013-07-29 MED ORDER — OXYCODONE-ACETAMINOPHEN 5-325 MG PO TABS
1.0000 | ORAL_TABLET | Freq: Once | ORAL | Status: AC
  Administered 2013-07-29: 1 via ORAL
  Filled 2013-07-29: qty 1

## 2013-07-29 NOTE — ED Provider Notes (Signed)
5:44 PM After discharge, nurse told me patient was upset.  I had discussed with patient labs/imaging and given her clinical appearance, she appeared safe/stable for d/c home.  However she did not want to discuss with me any further details prior to leaving    Chloe [name] W Danne Vasek, MD 07/29/13 [phone]

## 2013-07-29 NOTE — ED Provider Notes (Signed)
CSN: [id number]     Arrival date & time 07/29/13  1349 History   First MD Initiated Contact with Patient 07/29/13 1511     Chief Complaint  Patient presents with  . Flank Pain     Patient is a 38 y.o. female presenting with flank pain. The history is provided by the patient.  Flank Pain This is a recurrent problem. The current episode started more than 2 days ago. The problem occurs daily. The problem has been gradually worsening. Associated symptoms include abdominal pain and shortness of breath. Pertinent negatives include no chest pain. Exacerbated by: palpation. Nothing relieves the symptoms. She has tried rest for the symptoms. The treatment provided no relief.  Pt reports onset of right flank pain (no trauma) 3 days ago.  It is now radiating into abdomen.  She reports h/o kidney stones and this feels similar (denies surgical intervention) She reports urinary frequency.  She reports fever/chills (no recorded fever). She also reports increased cough, congestion and SOB due to cough No hemoptysis No CP is reported   She mentioned incontinence, but reports she urinates frequently and will have small amt of urinary incontinence but this is not new and is actually chronic and she denies fecal incontinence. No focal leg weakness reported   Past Medical History  Diagnosis Date  . Hypertension   . Kidney stones    Past Surgical History  Procedure Laterality Date  . Cholecystectomy    . Abdominal hysterectomy    . Neck surgery    . Eye surgery     Family History  Problem Relation Age of Onset  . Cancer Father    History  Substance Use Topics  . Smoking status: Current Every Day Smoker -- 1.00 packs/day for 15 years    Types: Cigarettes  . Smokeless tobacco: Never Used  . Alcohol Use: No   OB History   Grav Para Term Preterm Abortions TAB SAB Ect Mult Living   3 3 1 2      3      Review of Systems  Constitutional: Positive for chills.  HENT: Positive for congestion.    Respiratory: Positive for shortness of breath.   Cardiovascular: Negative for chest pain.  Gastrointestinal: Positive for abdominal pain.  Genitourinary: Positive for frequency and flank pain.  Neurological: Negative for weakness.  All other systems reviewed and are negative.      Allergies  Review of patient's allergies indicates no known allergies.  Home Medications   Current Outpatient Rx  Name  Route  Sig  Dispense  Refill  . chlorpheniramine-HYDROcodone (TUSSIONEX PENNKINETIC ER) 10-8 MG/5ML LQCR   Oral   Take 5 mLs by mouth every 12 (twelve) hours as needed for cough.   115 mL   0    BP 124/59  Pulse 94  Temp(Src) 99.6 F (37.6 C) (Oral)  Resp 20  Ht 5\' 4"  (1.626 m)  Wt 260 lb (117.935 kg)  BMI 44.61 kg/m2  SpO2 100% Physical Exam CONSTITUTIONAL: Well developed/well nourished HEAD: Normocephalic/atraumatic EYES: EOMI/PERRL ENMT: Mucous membranes moist NECK: supple no meningeal signs SPINE:entire spine nontender CV: S1/S2 noted, no murmurs/rubs/gallops noted LUNGS: Lungs are clear to auscultation bilaterally, no apparent distress ABDOMEN: soft, nontender, no rebound or guarding WU:[id number]:right cva tenderness NEURO: Pt is awake/alert, moves all extremitiesx4 Equal strength noted in bilateral LE with hip flex/knee flex/extension and ankle dorsi/plantar flexion EXTREMITIES: pulses normal, full ROM, no LE edema SKIN: warm, color normal PSYCH: no abnormalities of mood noted  ED  Course  Procedures  5:21 PM Pt with cough as well as flank pain.  Will get CXR to r/o pneumonia as well as evaluate for UTI Pt currently stable at this time 5:22 PM Pt resting comfortably, no distress We discussed imaging/labs Given recent cough, congestion/chills, suspect viral URI given negative CXR No signs of pyelonephritis by u/a  On repeat exam, her pain is right lower costal margin/flank and no bruising is noted.  She is tender to palpation.  I doubt ACS/PE as cause of pain.  Her  vitals are unremarkable Advised to quit smoking, will give meds to help with cough We discussed strict return precautions    Labs Review Labs Reviewed  URINALYSIS, ROUTINE W REFLEX MICROSCOPIC   Imaging Review Dg Chest 2 View  07/29/2013   CLINICAL DATA:  Cough  EXAM: CHEST  2 VIEW  COMPARISON:  DG CHEST 2 VIEW dated 01/31/2012  FINDINGS: The heart size and mediastinal contours are within normal limits. Both lungs are clear. The visualized skeletal structures are unremarkable.  IMPRESSION: No active cardiopulmonary disease.   Electronically Signed   By: Esperanza Heir M.D.   On: 07/29/2013 16:35      MDM   Final diagnoses:  Cough  Flank pain    Nursing notes including past medical history and social history reviewed and considered in documentation xrays reviewed and considered Labs/vital reviewed and considered     Joya Gaskins, MD 07/29/13 1725

## 2013-07-29 NOTE — ED Notes (Signed)
Patient c/o right flank pain x2-3 days. Patient reports incontinence of urine. Per patient woke this morning fever with chills and generalized body aches. Denies any nausea, vomiting, or diarrhea. Patient has hx of kidney stones. Denies any blood in urine that she has noted.

## 2013-07-29 NOTE — ED Notes (Signed)
Pt left very upset about her care.  Pt refused discharge vital signs.  Says, "the doctor did not do anything."  Told pt I would be glad to notify Dr. Bebe ShaggyWickline so he can come talk to pt, pt refused.  Pt states, "It's not going to matter, it's not going to change anything."

## 2013-07-29 NOTE — ED Notes (Signed)
Pt says she is leaving and going to [facility].

## 2014-02-19 ENCOUNTER — Emergency Department (HOSPITAL_COMMUNITY)
Admission: EM | Admit: 2014-02-19 | Discharge: 2014-02-19 | Disposition: A | Discharge status: Home / Self Care | Payer: Self-pay | Attending: Emergency Medicine | Admitting: Emergency Medicine

## 2014-02-19 ENCOUNTER — Encounter (HOSPITAL_COMMUNITY): Payer: Self-pay | Admitting: Emergency Medicine

## 2014-02-19 ENCOUNTER — Emergency Department (HOSPITAL_COMMUNITY): Payer: Self-pay

## 2014-02-19 DIAGNOSIS — K209 Esophagitis, unspecified without bleeding: Secondary | ICD-10-CM | POA: Insufficient documentation

## 2014-02-19 DIAGNOSIS — Z79899 Other long term (current) drug therapy: Secondary | ICD-10-CM | POA: Insufficient documentation

## 2014-02-19 DIAGNOSIS — F17200 Nicotine dependence, unspecified, uncomplicated: Secondary | ICD-10-CM | POA: Insufficient documentation

## 2014-02-19 DIAGNOSIS — R109 Unspecified abdominal pain: Secondary | ICD-10-CM | POA: Insufficient documentation

## 2014-02-19 DIAGNOSIS — Z90710 Acquired absence of both cervix and uterus: Secondary | ICD-10-CM | POA: Insufficient documentation

## 2014-02-19 DIAGNOSIS — Z9089 Acquired absence of other organs: Secondary | ICD-10-CM | POA: Insufficient documentation

## 2014-02-19 DIAGNOSIS — K2990 Gastroduodenitis, unspecified, without bleeding: Secondary | ICD-10-CM

## 2014-02-19 DIAGNOSIS — K2970 Gastritis, unspecified, without bleeding: Secondary | ICD-10-CM | POA: Insufficient documentation

## 2014-02-19 DIAGNOSIS — Z87442 Personal history of urinary calculi: Secondary | ICD-10-CM | POA: Insufficient documentation

## 2014-02-19 DIAGNOSIS — R079 Chest pain, unspecified: Secondary | ICD-10-CM | POA: Insufficient documentation

## 2014-02-19 DIAGNOSIS — I10 Essential (primary) hypertension: Secondary | ICD-10-CM | POA: Insufficient documentation

## 2014-02-19 LAB — CBC WITH DIFFERENTIAL/PLATELET
BASOS PCT: 0 % (ref 0–1)
Basophils Absolute: 0 10*3/uL (ref 0.0–0.1)
Eosinophils Absolute: 0.1 10*3/uL (ref 0.0–0.7)
Eosinophils Relative: 1 % (ref 0–5)
HEMATOCRIT: 45.8 % (ref 36.0–46.0)
HEMOGLOBIN: 15.9 g/dL — AB (ref 12.0–15.0)
LYMPHS ABS: 0.6 10*3/uL — AB (ref 0.7–4.0)
LYMPHS PCT: 6 % — AB (ref 12–46)
MCH: 32.2 pg (ref 26.0–34.0)
MCHC: 34.7 g/dL (ref 30.0–36.0)
MCV: 92.7 fL (ref 78.0–100.0)
MONO ABS: 0 10*3/uL — AB (ref 0.1–1.0)
MONOS PCT: 0 % — AB (ref 3–12)
NEUTROS ABS: 9.5 10*3/uL — AB (ref 1.7–7.7)
NEUTROS PCT: 93 % — AB (ref 43–77)
Platelets: 167 10*3/uL (ref 150–400)
RBC: 4.94 MIL/uL (ref 3.87–5.11)
RDW: 12.5 % (ref 11.5–15.5)
WBC: 10.3 10*3/uL (ref 4.0–10.5)

## 2014-02-19 LAB — COMPREHENSIVE METABOLIC PANEL
ALBUMIN: 4 g/dL (ref 3.5–5.2)
ALK PHOS: 115 U/L (ref 39–117)
ALT: 344 U/L — ABNORMAL HIGH (ref 0–35)
ANION GAP: 16 — AB (ref 5–15)
AST: 551 U/L — ABNORMAL HIGH (ref 0–37)
BILIRUBIN TOTAL: 1.4 mg/dL — AB (ref 0.3–1.2)
BUN: 10 mg/dL (ref 6–23)
CHLORIDE: 101 meq/L (ref 96–112)
CO2: 23 meq/L (ref 19–32)
CREATININE: 0.78 mg/dL (ref 0.50–1.10)
Calcium: 8.9 mg/dL (ref 8.4–10.5)
GLUCOSE: 158 mg/dL — AB (ref 70–99)
POTASSIUM: 3.9 meq/L (ref 3.7–5.3)
Sodium: 140 mEq/L (ref 137–147)
Total Protein: 7.4 g/dL (ref 6.0–8.3)

## 2014-02-19 LAB — HEPATIC FUNCTION PANEL
ALBUMIN: 3.9 g/dL (ref 3.5–5.2)
ALK PHOS: 117 U/L (ref 39–117)
ALT: 346 U/L — AB (ref 0–35)
AST: 549 U/L — ABNORMAL HIGH (ref 0–37)
Bilirubin, Direct: 0.8 mg/dL — ABNORMAL HIGH (ref 0.0–0.3)
Indirect Bilirubin: 0.6 mg/dL (ref 0.3–0.9)
Total Bilirubin: 1.4 mg/dL — ABNORMAL HIGH (ref 0.3–1.2)
Total Protein: 7.4 g/dL (ref 6.0–8.3)

## 2014-02-19 LAB — TROPONIN I: Troponin I: <0.3 ng/mL (ref <0.30)

## 2014-02-19 LAB — LIPASE, BLOOD: LIPASE: 41 U/L (ref 11–59)

## 2014-02-19 MED ORDER — IOHEXOL 300 MG/ML  SOLN
100.0000 mL | Freq: Once | INTRAMUSCULAR | Status: AC | PRN
  Administered 2014-02-19: 100 mL via INTRAVENOUS

## 2014-02-19 MED ORDER — SODIUM CHLORIDE 0.9 % IV BOLUS (SEPSIS)
1000.0000 mL | Freq: Once | INTRAVENOUS | Status: AC
  Administered 2014-02-19: 1000 mL via INTRAVENOUS

## 2014-02-19 MED ORDER — HYDROMORPHONE HCL PF 1 MG/ML IJ SOLN
1.0000 mg | INTRAMUSCULAR | Status: DC | PRN
  Administered 2014-02-19: 1 mg via INTRAMUSCULAR

## 2014-02-19 MED ORDER — ONDANSETRON 4 MG PO TBDP: 4.0000 mg | ORAL_TABLET | Freq: Three times a day (TID) | ORAL | Status: DC | PRN

## 2014-02-19 MED ORDER — HYDROMORPHONE HCL PF 1 MG/ML IJ SOLN: 1.0000 mg | INTRAMUSCULAR | Status: DC | PRN

## 2014-02-19 MED ORDER — SUCRALFATE 1 G PO TABS: 1.0000 g | ORAL_TABLET | Freq: Four times a day (QID) | ORAL | Status: DC

## 2014-02-19 MED ORDER — ONDANSETRON 4 MG PO TBDP
4.0000 mg | ORAL_TABLET | Freq: Once | ORAL | Status: AC
  Administered 2014-02-19: 4 mg via ORAL
  Filled 2014-02-19: qty 1

## 2014-02-19 MED ORDER — HYDROCODONE-ACETAMINOPHEN 5-325 MG PO TABS: 1.0000 | ORAL_TABLET | ORAL | Status: DC | PRN

## 2014-02-19 MED ORDER — IOHEXOL 300 MG/ML  SOLN
50.0000 mL | Freq: Once | INTRAMUSCULAR | Status: AC | PRN
  Administered 2014-02-19: 50 mL via ORAL

## 2014-02-19 MED ORDER — PANTOPRAZOLE SODIUM 40 MG IV SOLR
40.0000 mg | Freq: Once | INTRAVENOUS | Status: AC
  Administered 2014-02-19: 40 mg via INTRAVENOUS
  Filled 2014-02-19: qty 40

## 2014-02-19 MED ORDER — HYDROMORPHONE HCL PF 1 MG/ML IJ SOLN
1.0000 mg | INTRAMUSCULAR | Status: DC | PRN
  Administered 2014-02-19: 1 mg via INTRAVENOUS
  Filled 2014-02-19 (×2): qty 1

## 2014-02-19 MED ORDER — OMEPRAZOLE 20 MG PO CPDR: 20.0000 mg | DELAYED_RELEASE_CAPSULE | Freq: Two times a day (BID) | ORAL | Status: DC

## 2014-02-19 MED ORDER — ONDANSETRON HCL 4 MG/2ML IJ SOLN
4.0000 mg | Freq: Once | INTRAMUSCULAR | Status: AC
  Administered 2014-02-19: 4 mg via INTRAVENOUS
  Filled 2014-02-19: qty 2

## 2014-02-19 NOTE — ED Provider Notes (Signed)
CSN: [id number]     Arrival date & time 02/19/14  1855 History   First MD Initiated Contact with Patient 02/19/14 1903     Chief Complaint  Patient presents with  . Chest Pain      HPI  Patient presents with epigastric pain vomiting and diarrhea starting today.  Epigastric pain frequent vomiting. Started hurting in her chest after her vomiting started and presents here with her hand over her epigastrium complaining of nausea. Denies blood or bilious emesis. No chest pain or back pain the time of my examination.  Past Medical History  Diagnosis Date  . Hypertension   . Kidney stones    Past Surgical History  Procedure Laterality Date  . Cholecystectomy    . Abdominal hysterectomy    . Neck surgery    . Eye surgery     Family History  Problem Relation Age of Onset  . Cancer Father    History  Substance Use Topics  . Smoking status: Current Every Day Smoker -- 1.00 packs/day for 15 years    Types: Cigarettes  . Smokeless tobacco: Never Used  . Alcohol Use: No   OB History   Grav Para Term Preterm Abortions TAB SAB Ect Mult Living   Review of Systems  Constitutional: Negative for fever, chills, diaphoresis, appetite change and fatigue.  HENT: Negative for mouth sores, sore throat and trouble swallowing.   Eyes: Negative for visual disturbance.  Respiratory: Negative for cough, chest tightness, shortness of breath and wheezing.   Cardiovascular: Positive for chest pain.  Gastrointestinal: Positive for nausea, vomiting and abdominal pain. Negative for diarrhea and abdominal distention.  Endocrine: Negative for polydipsia, polyphagia and polyuria.  Genitourinary: Negative for dysuria, frequency and hematuria.  Musculoskeletal: Negative for gait problem.  Skin: Negative for color change, pallor and rash.  Neurological: Negative for dizziness, syncope, light-headedness and headaches.  Hematological: Does not bruise/bleed easily.    Psychiatric/Behavioral: Negative for behavioral problems and confusion.      Allergies  Review of patient's allergies indicates no known allergies.  Home Medications   Prior to Admission medications   Medication Sig Start Date End Date Taking? Authorizing Provider  ranitidine (ZANTAC) 150 MG tablet Take 150 mg by mouth daily as needed for heartburn (stomach pain).   Yes Historical Provider, MD  HYDROcodone-acetaminophen (NORCO/VICODIN) 5-325 MG per tablet Take 1 tablet by mouth every 4 (four) hours as needed. 02/19/14   Rolland Porter, MD  omeprazole (PRILOSEC) 20 MG capsule Take 1 capsule (20 mg total) by mouth 2 (two) times daily. 02/19/14   Rolland Porter, MD  ondansetron (ZOFRAN ODT) 4 MG disintegrating tablet Take 1 tablet (4 mg total) by mouth every 8 (eight) hours as needed for nausea. 02/19/14   Rolland Porter, MD  sucralfate (CARAFATE) 1 G tablet Take 1 tablet (1 g total) by mouth 4 (four) times daily. 02/19/14   Rolland Porter, MD   BP 103/66  Pulse 102  Temp(Src) 98.8 F (37.1 C)  Resp 17  Ht  (1.626 m)  Wt 248 lb (112.492 kg)  BMI 42.55 kg/m2  SpO2 97% Physical Exam  Constitutional: She is oriented to person, place, and time. She appears well-developed and well-nourished. No distress.  HENT:  Head: Normocephalic.  Eyes: Conjunctivae are normal. Pupils are equal, round, and reactive to light. No scleral icterus.  Neck: Normal range of motion. Neck supple. No thyromegaly present.  Cardiovascular:  Normal rate and regular rhythm.  Exam reveals no gallop and no friction rub.   No murmur heard. Pulmonary/Chest: Effort normal and breath sounds normal. No respiratory distress. She has no wheezes. She has no rales.  Abdominal: Soft. Bowel sounds are normal. She exhibits no distension. There is tenderness. There is no rebound.    Musculoskeletal: Normal range of motion.  Neurological: She is alert and oriented to person, place, and time.  Skin: Skin is warm and dry. No rash noted.   Psychiatric: She has a normal mood and affect. Her behavior is normal.    ED Course  Procedures (including critical care time) Labs Review Labs Reviewed  CBC WITH DIFFERENTIAL - Abnormal; Notable for the following:    Hemoglobin 15.9 (*)    Neutrophils Relative % 93 (*)    Neutro Abs 9.5 (*)    Lymphocytes Relative 6 (*)    Lymphs Abs 0.6 (*)    Monocytes Relative 0 (*)    Monocytes Absolute 0.0 (*)    All other components within normal limits  COMPREHENSIVE METABOLIC PANEL - Abnormal; Notable for the following:    Glucose, Bld 158 (*)    AST 551 (*)    ALT 344 (*)    Total Bilirubin 1.4 (*)    Anion gap 16 (*)    All other components within normal limits  HEPATIC FUNCTION PANEL - Abnormal; Notable for the following:    AST 549 (*)    ALT 346 (*)    Total Bilirubin 1.4 (*)    Bilirubin, Direct 0.8 (*)    All other components within normal limits  LIPASE, BLOOD  TROPONIN I  HEPATITIS PANEL, ACUTE  URINALYSIS, ROUTINE W REFLEX MICROSCOPIC    Imaging Review Ct Abdomen Pelvis W Contrast  02/19/2014   CLINICAL DATA:  Vomiting diarrhea beginning today, sharp chest pain. Shortness of breath. Prior cholecystectomy.  EXAM: CT ABDOMEN AND PELVIS WITH CONTRAST  TECHNIQUE: Multidetector CT imaging of the abdomen and pelvis was performed using the standard protocol following bolus administration of intravenous contrast.  CONTRAST:  50mL OMNIPAQUE IOHEXOL 300 MG/ML SOLN, OMNIPAQUE IOHEXOL 300 MG/ML SOLN  COMPARISON:  Acute abdominal series February 19, 2014 and CT of the abdomen and pelvis September 29, 2009  FINDINGS: LUNG BASES: Included view of the lung bases are clear. Visualized heart and pericardium are unremarkable. Mildly thickened appearance of the distal included esophagus, with intraluminal enteric contrast.  SOLID ORGANS: The liver, spleen, pancreas and adrenal glands are unremarkable. Status post cholecystectomy.  GASTROINTESTINAL TRACT: The stomach, small and large bowel are  normal in course and caliber without inflammatory changes. Normal appendix.  KIDNEYS/ URINARY TRACT: Kidneys are orthotopic, demonstrating symmetric enhancement. 3 mm right lower pole, 2 mm left interpolar, 2 mm left interpolar nephrolithiasis. Too small to characterize hypodensities in the kidneys bilaterally. No hydronephrosis or solid renal masses. The unopacified ureters are normal in course and caliber. Urinary bladder is partially distended and unremarkable.  PERITONEUM/RETROPERITONEUM: No intraperitoneal free fluid nor free air. Aortoiliac vessels are normal in course and caliber. Stable 10 mm short access portal caval lymph node. Status post hysterectomy. Bilobed cystic left adnexal mass measuring 5.7 x 3 cm.  SOFT TISSUE/OSSEOUS STRUCTURES: Nonsuspicious.  IMPRESSION: Thickened distal included esophagus can be seen with esophagitis. Intraluminal enteric contrast most consistent with reflux.  Bilobed cystic left adnexal mass for which follow-up pelvic ultrasound is recommended, status post hysterectomy.  Bilateral nonobstructing nephrolithiasis measuring up to 3 mm.   Electronically Signed  By: Awilda Metro   On: 02/19/2014 22:12   Dg Abd Acute W/chest  02/19/2014   CLINICAL DATA:  Nausea, vomiting, diarrhea, weakness, upper abdominal pain, chest pain and shortness of breath.  EXAM: ACUTE ABDOMEN SERIES (ABDOMEN 2 VIEW & CHEST 1 VIEW)  COMPARISON:  Chest 07/29/2013  FINDINGS: There is no evidence of dilated bowel loops or free intraperitoneal air. No radiopaque calculi or other significant radiographic abnormality is seen. Heart size and mediastinal contours are within normal limits. Both lungs are clear. Postoperative changes in the cervical spine. Surgical clips in the right upper quadrant.  IMPRESSION: Negative abdominal radiographs.  No acute cardiopulmonary disease.   Electronically Signed   By: Burman Nieves M.D.   On: 02/19/2014 21:00     EKG Interpretation None      MDM   Final  diagnoses:  Esophagitis  Gastritis    Patient improved after IM, and IV pain medications. Elevated transaminases. No obvious cause. She denies alcohol use. No ascending medications. No history of hepatitis. Hepatitis panel pending. Structurally normal liver on CT. Surgically absent her gallbladder. CT shows esophagitis. Think is appropriate for outpatient treatment with Carafate, PPI, pain medication, liquid diet, GI followup.  Patient made aware of her CT scan abnormalities including a cystic adnexal mass. She states that she hasn't seen GYN since her hysterectomy. Encouraged to do so. I stressed the importance of following up abnormal findings. I think she may need EGD at some point as well as her symptoms do not improve. No clinical suggestion of perforation/peritonitis. No sign of perforation on CT.    Rolland Porter, MD 02/19/14 2256

## 2014-02-19 NOTE — ED Notes (Signed)
Pt reports increased abd pain while drinking PO contrast.

## 2014-02-19 NOTE — ED Notes (Signed)
Pt reports vomiting and diarrhea that started today and started having severe sharp chest pain around 1330 today.  Reports SOB.  Pt restless.

## 2014-02-19 NOTE — ED Notes (Signed)
Patient transported to CT 

## 2014-02-19 NOTE — ED Notes (Signed)
Pt reports epigastric discomfort that has been occurring for "a while" - pt takes PO antiacids as needed w/ minimal relief, today pt states the epigastric pain has gotten progressively worse, pt began vomiting and experiencing central chest discomfort as well. Pt is A&Ox4 on assessment, grimacing and guarding.

## 2014-02-19 NOTE — Discharge Instructions (Signed)
Liquids only today and tomorrow, or until your symptoms improve. Small frequent meals. Avoid alcohol, tobacco, caffeine, and anti-inflammatory medications. Call GI Dr. above for recheck. Your liver enzymes are elevated. You will need followup evaluation to ensure that these are not worsening. You have a hepatitis test performed tonight to be resulted for several days. He did receive the results of your testing at the followup appointment.  Gastritis, Adult Gastritis is soreness and swelling (inflammation) of the lining of the stomach. Gastritis can develop as a sudden onset (acute) or long-term (chronic) condition. If gastritis is not treated, it can lead to stomach bleeding and ulcers. CAUSES  Gastritis occurs when the stomach lining is weak or damaged. Digestive juices from the stomach then inflame the weakened stomach lining. The stomach lining may be weak or damaged due to viral or bacterial infections. One common bacterial infection is the Helicobacter pylori infection. Gastritis can also result from excessive alcohol consumption, taking certain medicines, or having too much acid in the stomach.  SYMPTOMS  In some cases, there are no symptoms. When symptoms are present, they may include:  Pain or a burning sensation in the upper abdomen.  Nausea.  Vomiting.  An uncomfortable feeling of fullness after eating. DIAGNOSIS  Your caregiver may suspect you have gastritis based on your symptoms and a physical exam. To determine the cause of your gastritis, your caregiver may perform the following:  Blood or stool tests to check for the H pylori bacterium.  Gastroscopy. A thin, flexible tube (endoscope) is passed down the esophagus and into the stomach. The endoscope has a light and camera on the end. Your caregiver uses the endoscope to view the inside of the stomach.  Taking a tissue sample (biopsy) from the stomach to examine under a microscope. TREATMENT  Depending on the cause of your  gastritis, medicines may be prescribed. If you have a bacterial infection, such as an H pylori infection, antibiotics may be given. If your gastritis is caused by too much acid in the stomach, H2 blockers or antacids may be given. Your caregiver may recommend that you stop taking aspirin, ibuprofen, or other nonsteroidal anti-inflammatory drugs (NSAIDs). HOME CARE INSTRUCTIONS  Only take over-the-counter or prescription medicines as directed by your caregiver.  If you were given antibiotic medicines, take them as directed. Finish them even if you start to feel better.  Drink enough fluids to keep your urine clear or pale yellow.  Avoid foods and drinks that make your symptoms worse, such as:  Caffeine or alcoholic drinks.  Chocolate.  Peppermint or mint flavorings.  Garlic and onions.  Spicy foods.  Citrus fruits, such as oranges, lemons, or limes.  Tomato-based foods such as sauce, chili, salsa, and pizza.  Fried and fatty foods.  Eat small, frequent meals instead of large meals. SEEK IMMEDIATE MEDICAL CARE IF:   You have black or dark red stools.  You vomit blood or material that looks like coffee grounds.  You are unable to keep fluids down.  Your abdominal pain gets worse.  You have a fever.  You do not feel better after 1 week.  You have any other questions or concerns. MAKE SURE YOU:  Understand these instructions.  Will watch your condition.  Will get help right away if you are not doing well or get worse. Document Released: 05/23/2001 Document Revised: 11/28/2011 Document Reviewed: 07/12/2011 [facility] Patient Information 2015 [place], [place]. This information is not intended to replace advice given to you by your health care  provider. Make sure you discuss any questions you have with your health care provider.  Esophagitis Esophagitis is inflammation of the esophagus. It can involve swelling, soreness, and pain in the esophagus. This condition can make it  difficult and painful to swallow. CAUSES  Most causes of esophagitis are not serious. Many different factors can cause esophagitis, including:  Gastroesophageal reflux disease (GERD). This is when acid from your stomach flows up into the esophagus.  Recurrent vomiting.  An allergic-type reaction.  Certain medicines, especially those that come in large pills.  Ingestion of harmful chemicals, such as household cleaning products.  Heavy alcohol use.  An infection of the esophagus.  Radiation treatment for cancer.  Certain diseases such as sarcoidosis, Crohn's disease, and scleroderma. These diseases may cause recurrent esophagitis. SYMPTOMS   Trouble swallowing.  Painful swallowing.  Chest pain.  Difficulty breathing.  Nausea.  Vomiting.  Abdominal pain. DIAGNOSIS  Your caregiver will take your history and do a physical exam. Depending upon what your caregiver finds, certain tests may also be done, including:  Barium X-ray. You will drink a solution that coats the esophagus, and X-rays will be taken.  Endoscopy. A lighted tube is put down the esophagus so your caregiver can examine the area.  Allergy tests. These can sometimes be arranged through follow-up visits. TREATMENT  Treatment will depend on the cause of your esophagitis. In some cases, steroids or other medicines may be given to help relieve your symptoms or to treat the underlying cause of your condition. Medicines that may be recommended include:  Viscous lidocaine, to soothe the esophagus.  Antacids.  Acid reducers.  Proton pump inhibitors.  Antiviral medicines for certain viral infections of the esophagus.  Antifungal medicines for certain fungal infections of the esophagus.  Antibiotic medicines, depending on the cause of the esophagitis. HOME CARE INSTRUCTIONS   Avoid foods and drinks that seem to make your symptoms worse.  Eat small, frequent meals instead of large meals.  Avoid eating  for the 3 hours prior to your bedtime.  If you have trouble taking pills, use a pill splitter to decrease the size and likelihood of the pill getting stuck or injuring the esophagus on the way down. Drinking water after taking a pill also helps.  Stop smoking if you smoke.  Maintain a healthy weight.  Wear loose-fitting clothing. Do not wear anything tight around your waist that causes pressure on your stomach.  Raise the head of your bed 6 to 8 inches with wood blocks to help you sleep. Extra pillows will not help.  Only take over-the-counter or prescription medicines as directed by your caregiver. SEEK IMMEDIATE MEDICAL CARE IF:  You have severe chest pain that radiates into your arm, neck, or jaw.  You feel sweaty, dizzy, or lightheaded.  You have shortness of breath.  You vomit blood.  You have difficulty or pain with swallowing.  You have bloody or black, tarry stools.  You have a fever.  You have a burning sensation in the chest more than 3 times a week for more than 2 weeks.  You cannot swallow, drink, or eat.  You drool because you cannot swallow your saliva. MAKE SURE YOU:  Understand these instructions.  Will watch your condition.  Will get help right away if you are not doing well or get worse. Document Released: 07/06/2004 Document Revised: 08/21/2011 Document Reviewed: 01/27/2011 [facility] Patient Information 2015 [place], [place]. This information is not intended to replace advice given to you by your  health care provider. Make sure you discuss any questions you have with your health care provider. ° °

## 2014-02-20 LAB — HEPATITIS PANEL, ACUTE
HCV Ab: NEGATIVE
HEP B C IGM: NONREACTIVE
Hep A IgM: NONREACTIVE
Hepatitis B Surface Ag: NEGATIVE

## 2014-04-13 ENCOUNTER — Encounter (HOSPITAL_COMMUNITY): Payer: Self-pay | Admitting: Emergency Medicine

## 2016-09-12 ENCOUNTER — Ambulatory Visit: Payer: Self-pay | Admitting: Family Medicine

## 2016-09-21 ENCOUNTER — Encounter (HOSPITAL_COMMUNITY): Payer: Self-pay

## 2016-09-21 ENCOUNTER — Emergency Department (HOSPITAL_COMMUNITY)
Admission: EM | Admit: 2016-09-21 | Discharge: 2016-09-21 | Disposition: A | Discharge status: Home / Self Care | Payer: Medicaid Other | Attending: Emergency Medicine | Admitting: Emergency Medicine

## 2016-09-21 ENCOUNTER — Emergency Department (HOSPITAL_COMMUNITY): Payer: Medicaid Other

## 2016-09-21 DIAGNOSIS — F17210 Nicotine dependence, cigarettes, uncomplicated: Secondary | ICD-10-CM | POA: Insufficient documentation

## 2016-09-21 DIAGNOSIS — Z79899 Other long term (current) drug therapy: Secondary | ICD-10-CM | POA: Insufficient documentation

## 2016-09-21 DIAGNOSIS — N132 Hydronephrosis with renal and ureteral calculous obstruction: Secondary | ICD-10-CM

## 2016-09-21 DIAGNOSIS — N1330 Unspecified hydronephrosis: Secondary | ICD-10-CM | POA: Diagnosis not present

## 2016-09-21 DIAGNOSIS — I10 Essential (primary) hypertension: Secondary | ICD-10-CM | POA: Insufficient documentation

## 2016-09-21 DIAGNOSIS — R109 Unspecified abdominal pain: Secondary | ICD-10-CM | POA: Diagnosis present

## 2016-09-21 DIAGNOSIS — N201 Calculus of ureter: Secondary | ICD-10-CM | POA: Insufficient documentation

## 2016-09-21 LAB — URINALYSIS, ROUTINE W REFLEX MICROSCOPIC
BACTERIA UA: NONE SEEN
Bilirubin Urine: NEGATIVE
Glucose, UA: NEGATIVE mg/dL
Ketones, ur: NEGATIVE mg/dL
Leukocytes, UA: NEGATIVE
NITRITE: NEGATIVE
Protein, ur: NEGATIVE mg/dL
Specific Gravity, Urine: 1.023 (ref 1.005–1.030)
pH: 5 (ref 5.0–8.0)

## 2016-09-21 LAB — CBC
HEMATOCRIT: 45.7 % (ref 36.0–46.0)
HEMOGLOBIN: 15.6 g/dL — AB (ref 12.0–15.0)
MCH: 32.2 pg (ref 26.0–34.0)
MCHC: 34.1 g/dL (ref 30.0–36.0)
MCV: 94.2 fL (ref 78.0–100.0)
Platelets: 225 10*3/uL (ref 150–400)
RBC: 4.85 MIL/uL (ref 3.87–5.11)
RDW: 12.3 % (ref 11.5–15.5)
WBC: 9.7 10*3/uL (ref 4.0–10.5)

## 2016-09-21 LAB — COMPREHENSIVE METABOLIC PANEL
ALT: 190 U/L — AB (ref 14–54)
AST: 64 U/L — ABNORMAL HIGH (ref 15–41)
Albumin: 4.3 g/dL (ref 3.5–5.0)
Alkaline Phosphatase: 172 U/L — ABNORMAL HIGH (ref 38–126)
Anion gap: 6 (ref 5–15)
BUN: 14 mg/dL (ref 6–20)
CHLORIDE: 105 mmol/L (ref 101–111)
CO2: 27 mmol/L (ref 22–32)
Calcium: 9.2 mg/dL (ref 8.9–10.3)
Creatinine, Ser: 0.68 mg/dL (ref 0.44–1.00)
GFR calc Af Amer: >60 mL/min (ref >60)
GFR calc non Af Amer: >60 mL/min (ref >60)
GLUCOSE: 108 mg/dL — AB (ref 65–99)
Potassium: 3.9 mmol/L (ref 3.5–5.1)
Sodium: 138 mmol/L (ref 135–145)
Total Bilirubin: 0.7 mg/dL (ref 0.3–1.2)
Total Protein: 7.5 g/dL (ref 6.5–8.1)

## 2016-09-21 LAB — LIPASE, BLOOD: Lipase: 25 U/L (ref 11–51)

## 2016-09-21 MED ORDER — ONDANSETRON HCL 4 MG/2ML IJ SOLN
INTRAMUSCULAR | Status: AC
  Filled 2016-09-21: qty 2

## 2016-09-21 MED ORDER — HYDROMORPHONE HCL 1 MG/ML IJ SOLN
1.0000 mg | Freq: Once | INTRAMUSCULAR | Status: AC
  Administered 2016-09-21: 1 mg via INTRAVENOUS
  Filled 2016-09-21: qty 1

## 2016-09-21 MED ORDER — OXYCODONE-ACETAMINOPHEN 5-325 MG PO TABS: 1.0000 | ORAL_TABLET | ORAL | 0 refills | Status: DC | PRN

## 2016-09-21 MED ORDER — ONDANSETRON HCL 4 MG/2ML IJ SOLN
4.0000 mg | Freq: Once | INTRAMUSCULAR | Status: AC
  Administered 2016-09-21: 4 mg via INTRAVENOUS
  Filled 2016-09-21: qty 2

## 2016-09-21 MED ORDER — HYDROMORPHONE HCL 1 MG/ML IJ SOLN
INTRAMUSCULAR | Status: AC
  Filled 2016-09-21: qty 1

## 2016-09-21 MED ORDER — ONDANSETRON 8 MG PO TBDP: 8.0000 mg | ORAL_TABLET | Freq: Three times a day (TID) | ORAL | 0 refills | Status: DC | PRN

## 2016-09-21 MED ORDER — HYDROMORPHONE HCL 1 MG/ML IJ SOLN
1.0000 mg | Freq: Once | INTRAMUSCULAR | Status: AC
  Administered 2016-09-21: 1 mg via INTRAVENOUS

## 2016-09-21 MED ORDER — ONDANSETRON HCL 4 MG/2ML IJ SOLN
4.0000 mg | Freq: Once | INTRAMUSCULAR | Status: AC
  Administered 2016-09-21: 4 mg via INTRAVENOUS

## 2016-09-21 MED ORDER — TAMSULOSIN HCL 0.4 MG PO CAPS: 0.4000 mg | ORAL_CAPSULE | Freq: Every day | ORAL | 0 refills | Status: AC

## 2016-09-21 NOTE — ED Triage Notes (Signed)
c/o rlq abd pain radiating to back. Pain persistent with nausea.

## 2016-09-21 NOTE — Discharge Instructions (Signed)
Make sure you are drinking plenty of fluids and strain your urine so you will know when you pass a stone.  You may take the oxycodone prescribed for pain relief.  This will make you drowsy - do not drive within 4 hours of taking this medication. Take the flomax before bedtime as this will be less likely to affect your blood pressure during the day.

## 2016-09-21 NOTE — ED Notes (Signed)
Pt back from CT

## 2016-09-25 NOTE — ED Provider Notes (Signed)
[facility]-EMER[facility]ENCY DEPT Provider Note   CSN: [id number] Arrival date & time: 09/21/16  0911     History   Chief Complaint Chief Complaint  Patient presents with  . Abdominal Pain    HPI Chloe [name] is a 41 y.o. female with past medical and surgical history significant for kidney stones, abdominal historectomy and cholecystectomy with rlq pain, nausea without vomiting which started last night.  It has now started to radiate to her right lower back. She denies flank pain and has had no hematuria, fevers, chills, but does endorse increased urinary frequency.  She denies diarrhea or constipation, no vaginal discharge.   She does not have a urologist, states has always passed her stones.  She has found no alleviators. Nothing makes the pain worse .  The history is provided by the patient.    Past Medical History:  Diagnosis Date  . Hypertension   . Kidney stones     There are no active problems to display for this patient.   Past Surgical History:  Procedure Laterality Date  . ABDOMINAL HYSTERECTOMY    . CHOLECYSTECTOMY    . EYE SURGERY    . NECK SURGERY      OB History    Gravida Para Term Preterm AB Living   SAB TAB Ectopic Multiple Live Births                   Home Medications    Prior to Admission medications   Medication Sig Start Date End Date Taking? Authorizing Provider  diazepam (VALIUM) 10 MG tablet Take 5 mg by mouth once.   Yes Historical Provider, MD  ranitidine (ZANTAC) 150 MG tablet Take 150 mg by mouth daily as needed for heartburn (stomach pain).   Yes Historical Provider, MD  ondansetron (ZOFRAN ODT) 8 MG disintegrating tablet Take 1 tablet (8 mg total) by mouth every 8 (eight) hours as needed for nausea or vomiting. 09/21/16   Burgess Amor, PA-C  oxyCODONE-acetaminophen (PERCOCET/ROXICET) 5-325 MG tablet Take 1 tablet by mouth every 4 (four) hours as needed. 09/21/16   Burgess Amor, PA-C  tamsulosin (FLOMAX) 0.4 MG CAPS capsule Take 1  capsule (0.4 mg total) by mouth daily after supper. 09/21/16   Burgess Amor, PA-C    Family History Family History  Problem Relation Age of Onset  . Cancer Father     Social History Social History  Substance Use Topics  . Smoking status: Current Every Day Smoker    Packs/day: 1.00    Years: 15.00    Types: Cigarettes  . Smokeless tobacco: Never Used  . Alcohol use No     Allergies   Patient has no known allergies.   Review of Systems Review of Systems  Constitutional: Negative for fever.  HENT: Negative for congestion and sore throat.   Eyes: Negative.   Respiratory: Negative for chest tightness and shortness of breath.   Cardiovascular: Negative for chest pain.  Gastrointestinal: Positive for abdominal pain and nausea. Negative for diarrhea and vomiting.  Genitourinary: Positive for frequency. Negative for dysuria and hematuria.  Musculoskeletal: Positive for back pain. Negative for arthralgias, joint swelling and neck pain.  Skin: Negative.  Negative for rash and wound.  Neurological: Negative for dizziness, weakness, light-headedness, numbness and headaches.  Psychiatric/Behavioral: Negative.      Physical Exam Updated Vital Signs BP 107/62 (BP Location: Left Arm)   Pulse 61   Temp 97.9 F (36.6 C)  Resp 16   Wt 114.8 kg   SpO2 97%   BMI 43.43 kg/m   Physical Exam  Constitutional: She appears well-developed and well-nourished.  HENT:  Head: Normocephalic and atraumatic.  Eyes: Conjunctivae are normal.  Neck: Normal range of motion.  Cardiovascular: Normal rate, regular rhythm, normal heart sounds and intact distal pulses.   Pulmonary/Chest: Effort normal and breath sounds normal. She has no wheezes.  Abdominal: Soft. Bowel sounds are normal. There is tenderness in the right lower quadrant. There is no rigidity, no rebound, no guarding, no CVA tenderness and no tenderness at McBurney's point.  Musculoskeletal: Normal range of motion.  Neurological: She  is alert.  Skin: Skin is warm and dry.  Psychiatric: She has a normal mood and affect.  Nursing note and vitals reviewed.    ED Treatments / Results  Labs (all labs ordered are listed, but only abnormal results are displayed)   EKG  EKG Interpretation None       Radiology Results for orders placed or performed during the hospital encounter of 09/21/16  Lipase, blood  Result Value Ref Range   Lipase 25 11 - 51 U/L  Comprehensive metabolic panel  Result Value Ref Range   Sodium 138 135 - 145 mmol/L   Potassium 3.9 3.5 - 5.1 mmol/L   Chloride 105 101 - 111 mmol/L   CO2 27 22 - 32 mmol/L   Glucose, Bld 108 (H) 65 - 99 mg/dL   BUN 14 6 - 20 mg/dL   Creatinine, Ser 5.40 0.44 - 1.00 mg/dL   Calcium 9.2 8.9 - 98.1 mg/dL   Total Protein 7.5 6.5 - 8.1 g/dL   Albumin 4.3 3.5 - 5.0 g/dL   AST 64 (H) 15 - 41 U/L   ALT 190 (H) 14 - 54 U/L   Alkaline Phosphatase 172 (H) 38 - 126 U/L   Total Bilirubin 0.7 0.3 - 1.2 mg/dL   GFR calc non Af Amer >60 >60 mL/min   GFR calc Af Amer >60 >60 mL/min   Anion gap 6 5 - 15  CBC  Result Value Ref Range   WBC 9.7 4.0 - 10.5 K/uL   RBC 4.85 3.87 - 5.11 MIL/uL   Hemoglobin 15.6 (H) 12.0 - 15.0 g/dL   HCT 19.1 47.8 - 29.5 %   MCV 94.2 78.0 - 100.0 fL   MCH 32.2 26.0 - 34.0 pg   MCHC 34.1 30.0 - 36.0 g/dL   RDW 62.1 30.8 - 65.7 %   Platelets 225 150 - 400 K/uL  Urinalysis, Routine w reflex microscopic  Result Value Ref Range   Color, Urine AMBER (A) YELLOW   APPearance HAZY (A) CLEAR   Specific Gravity, Urine 1.023 1.005 - 1.030   pH 5.0 5.0 - 8.0   Glucose, UA NEGATIVE NEGATIVE mg/dL   Hgb urine dipstick MODERATE (A) NEGATIVE   Bilirubin Urine NEGATIVE NEGATIVE   Ketones, ur NEGATIVE NEGATIVE mg/dL   Protein, ur NEGATIVE NEGATIVE mg/dL   Nitrite NEGATIVE NEGATIVE   Leukocytes, UA NEGATIVE NEGATIVE   RBC / HPF TOO NUMEROUS TO COUNT 0 - 5 RBC/hpf   WBC, UA 0-5 0 - 5 WBC/hpf   Bacteria, UA NONE SEEN NONE SEEN   Squamous Epithelial  / LPF 6-30 (A) NONE SEEN   Mucous PRESENT    Ct Renal Stone Study  Result Date: 09/21/2016 CLINICAL DATA:  Right flank pain today, some periumbilical pain with nausea over the last week EXAM: CT ABDOMEN AND PELVIS WITHOUT  CONTRAST TECHNIQUE: Multidetector CT imaging of the abdomen and pelvis was performed following the standard protocol without IV contrast. COMPARISON:  CT abdomen pelvis of 02/19/2014 FINDINGS: Lower chest: The lung bases are clear. The heart is within normal limits in size. There is concentric thickening of the mucosa of the distal esophagus possibly due to esophagitis 4 hiatal hernia. Hepatobiliary: The liver is slightly low in attenuation consistent with fatty infiltration with some sparing near the gallbladder fossa. The gallbladder has previously been resected. Pancreas: The pancreas is normal in size and the pancreatic duct is not dilated. Spleen: The spleen is unremarkable. Adrenals/Urinary Tract: The adrenal glands appear normal. There are small bilateral renal calculi present. No left hydronephrosis is seen. However, there is right hydronephrosis and hydroureter to point of low-grade obstruction on the right by a proximal right ureteral calculus of no more than 2-3 mm in diameter. The left ureter is normal in caliber. The urinary bladder is not well distended but no abnormality is seen. Stomach/Bowel: The stomach is not well distended. No small bowel distention is seen. The colon is largely decompressed. The appendix and terminal ileum are unremarkable. Vascular/Lymphatic: The abdominal aorta is normal in caliber. No adenopathy is seen. Reproductive: The uterus has previously been resected. There is a rounded cystic structure in the mid pelvis consistent with a 4.9 cm ovarian cyst apparently emanating from the left ovary. No right ovarian abnormality is seen. No fluid is noted within the pelvis. Other: None. Musculoskeletal: There is central canal stenosis at L3-4 and L4-5 levels which  appears multifactorial. The lumbar vertebrae are in normal alignment. Degenerative change involves the facet joints of L3-4, L4-5, and L5-S1 levels. IMPRESSION: 1. Mild right hydronephrosis to a point of low-grade obstruction by a 2-3 mm proximal right ureteral calculus. 2. Small bilateral renal calculi also are noted. 3. Mild fatty infiltration of the liver with some sparing near the gallbladder. 4. 4.9 cm simple appearing left ovarian cyst. Consider pelvic ultrasound if further assessment is warranted. 5. 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 Electronically Signed   By: Dwyane Dee M.D.   On: 09/21/2016 11:25     Procedures Procedures (including critical care time)  Medications Ordered in ED Medications  HYDROmorphone (DILAUDID) injection 1 mg (1 mg Intravenous Given 09/21/16 1015)  ondansetron (ZOFRAN) injection 4 mg (4 mg Intravenous Given 09/21/16 1016)  HYDROmorphone (DILAUDID) injection 1 mg (1 mg Intravenous Given 09/21/16 1133)  ondansetron (ZOFRAN) injection 4 mg (4 mg Intravenous Given 09/21/16 1219)     Initial Impression / Assessment and Plan / ED Course  I have reviewed the triage vital signs and the nursing notes.  Pertinent labs & imaging results that were available during my care of the patient were reviewed by me and considered in my medical decision making (see chart for details).     Discussed lab and imaging findings.  Pt with acute ureteral stone. Advised increased fluids, strain urine, strainer given.  Also referral to urology for f/u care.  Discussed ovarian cyst, doubt this as source of acute pain, given this is left sided, and given urologic sx and and acute ureteral stone on imaging.  Final Clinical Impressions(s) / ED Diagnoses   Final diagnoses:  Ureteral stone with hydronephrosis    New Prescriptions Discharge Medication List as of 09/21/2016 12:51 PM    START taking these medications   Details  ondansetron (ZOFRAN ODT) 8 MG  disintegrating tablet Take 1 tablet (8 mg total) by mouth  every 8 (eight) hours as needed for nausea or vomiting., Starting Thu 09/21/2016, Print    oxyCODONE-acetaminophen (PERCOCET/ROXICET) 5-325 MG tablet Take 1 tablet by mouth every 4 (four) hours as needed., Starting Thu 09/21/2016, Print    tamsulosin (FLOMAX) 0.4 MG CAPS capsule Take 1 capsule (0.4 mg total) by mouth daily after supper., Starting Thu 09/21/2016, Print         Burgess Amor, PA-C 09/25/16 2003    Bethann Berkshire, MD 10/03/16 2146

## 2018-07-30 ENCOUNTER — Emergency Department (HOSPITAL_COMMUNITY)
Admission: EM | Admit: 2018-07-30 | Discharge: 2018-07-30 | Disposition: A | Discharge status: Home / Self Care | Payer: Self-pay | Attending: Emergency Medicine | Admitting: Emergency Medicine

## 2018-07-30 ENCOUNTER — Encounter (HOSPITAL_COMMUNITY): Payer: Self-pay

## 2018-07-30 ENCOUNTER — Other Ambulatory Visit: Payer: Self-pay

## 2018-07-30 DIAGNOSIS — K0889 Other specified disorders of teeth and supporting structures: Secondary | ICD-10-CM | POA: Insufficient documentation

## 2018-07-30 DIAGNOSIS — J069 Acute upper respiratory infection, unspecified: Secondary | ICD-10-CM | POA: Insufficient documentation

## 2018-07-30 DIAGNOSIS — F17210 Nicotine dependence, cigarettes, uncomplicated: Secondary | ICD-10-CM | POA: Insufficient documentation

## 2018-07-30 DIAGNOSIS — R509 Fever, unspecified: Secondary | ICD-10-CM | POA: Insufficient documentation

## 2018-07-30 DIAGNOSIS — R0981 Nasal congestion: Secondary | ICD-10-CM | POA: Insufficient documentation

## 2018-07-30 DIAGNOSIS — I10 Essential (primary) hypertension: Secondary | ICD-10-CM | POA: Insufficient documentation

## 2018-07-30 DIAGNOSIS — F1210 Cannabis abuse, uncomplicated: Secondary | ICD-10-CM | POA: Insufficient documentation

## 2018-07-30 DIAGNOSIS — M791 Myalgia, unspecified site: Secondary | ICD-10-CM | POA: Insufficient documentation

## 2018-07-30 MED ORDER — AMOXICILLIN 250 MG PO CAPS
500.0000 mg | ORAL_CAPSULE | Freq: Once | ORAL | Status: AC
  Administered 2018-07-30: 500 mg via ORAL
  Filled 2018-07-30: qty 2

## 2018-07-30 MED ORDER — ONDANSETRON HCL 4 MG PO TABS
4.0000 mg | ORAL_TABLET | Freq: Once | ORAL | Status: AC
  Administered 2018-07-30: 4 mg via ORAL
  Filled 2018-07-30: qty 1

## 2018-07-30 MED ORDER — ACETAMINOPHEN 500 MG PO TABS
1000.0000 mg | ORAL_TABLET | Freq: Once | ORAL | Status: AC
  Administered 2018-07-30: 1000 mg via ORAL
  Filled 2018-07-30: qty 2

## 2018-07-30 MED ORDER — IBUPROFEN 800 MG PO TABS
800.0000 mg | ORAL_TABLET | Freq: Once | ORAL | Status: AC
  Administered 2018-07-30: 800 mg via ORAL
  Filled 2018-07-30: qty 1

## 2018-07-30 MED ORDER — AMOXICILLIN 500 MG PO CAPS: 500.0000 mg | ORAL_CAPSULE | Freq: Three times a day (TID) | ORAL | 0 refills | Status: DC

## 2018-07-30 NOTE — ED Provider Notes (Signed)
[facility] EMERGENCY DEPARTMENT Provider Note   CSN: [id number] Arrival date & time: 07/30/18  1659    History   Chief Complaint Chief Complaint  Patient presents with  . Generalized Body Aches  . Dental Pain    HPI Chloe [name] is a 43 y.o. female.     Patient is a 43 year old female who presents to the emergency department with a complaint of dental pain.  The patient states that she is having right lower jaw dental pain.  She says it feels as though she may have an abscess.  She is not had high fever with this.  But she says she has pain from the angle of her right jaw coming around to her chin.  The patient denies diabetes.  She denies any medical issues that would interfere with her immune system.  She has not had any recent operations or procedures involving her mouth.  It is also of note that the patient has been having some generalized body aches and some fever over the last 4days.  She has had nasal congestion, and feels that she probably has an upper respiratory infection.  The patient states she has been able to keep this under control with over-the-counter medications.  The history is provided by the patient.  Dental Pain  Associated symptoms: congestion and fever   Associated symptoms: no neck pain     Past Medical History:  Diagnosis Date  . Hypertension   . Kidney stones     There are no active problems to display for this patient.   Past Surgical History:  Procedure Laterality Date  . ABDOMINAL HYSTERECTOMY    . CHOLECYSTECTOMY    . EYE SURGERY    . NECK SURGERY       OB History    Gravida  3   Para  3   Term  1   Preterm  2   AB      Living  3     SAB      TAB      Ectopic      Multiple      Live Births               Home Medications    Prior to Admission medications   Medication Sig Start Date End Date Taking? Authorizing Provider  diazepam (VALIUM) 10 MG tablet Take 5 mg by mouth once.    [provider]  ondansetron (ZOFRAN ODT) 8 MG disintegrating tablet Take 1 tablet (8 mg total) by mouth every 8 (eight) hours as needed for nausea or vomiting. 09/21/16   Idol, Raynelle Fanning, PA-C  oxyCODONE-acetaminophen (PERCOCET/ROXICET) 5-325 MG tablet Take 1 tablet by mouth every 4 (four) hours as needed. 09/21/16   Burgess Amor, PA-C  ranitidine (ZANTAC) 150 MG tablet Take 150 mg by mouth daily as needed for heartburn (stomach pain).    [provider]  tamsulosin (FLOMAX) 0.4 MG CAPS capsule Take 1 capsule (0.4 mg total) by mouth daily after supper. 09/21/16   Burgess Amor, PA-C    Family History Family History  Problem Relation Age of Onset  . Cancer Father     Social History Social History   Tobacco Use  . Smoking status: Current Every Day Smoker    Packs/day: 1.00    Years: 15.00    Pack years: 15.00    Types: Cigarettes  . Smokeless tobacco: Never Used  Substance Use Topics  . Alcohol use: No  . Drug  use: Yes    Types: Marijuana    Comment: occasionally     Allergies   Patient has no known allergies.   Review of Systems Review of Systems  Constitutional: Positive for fever. Negative for activity change.       All ROS Neg except as noted in HPI  HENT: Positive for congestion, dental problem and postnasal drip. Negative for nosebleeds.   Eyes: Negative for photophobia and discharge.  Respiratory: Negative for cough, shortness of breath and wheezing.   Cardiovascular: Negative for chest pain and palpitations.  Gastrointestinal: Negative for abdominal pain and blood in stool.  Genitourinary: Negative for dysuria, frequency and hematuria.  Musculoskeletal: Positive for myalgias. Negative for arthralgias, back pain and neck pain.  Skin: Negative.   Neurological: Negative for dizziness, seizures and speech difficulty.  Psychiatric/Behavioral: Negative for confusion and hallucinations.     Physical Exam Updated Vital Signs BP 134/66 (BP Location: Right Arm)   Pulse 86    Temp 98.6 F (37 C) (Oral)   Resp 16   Ht 5\' 5"  (1.651 m)   Wt 120.2 kg   SpO2 98%   BMI 44.10 kg/m   Physical Exam Vitals signs and nursing note reviewed.  Constitutional:      Appearance: She is well-developed. She is not toxic-appearing.  HENT:     Head: Normocephalic.     Comments: There is swelling in the area of the premolar and first molar of the right lower jaw.  No visible abscess appreciated.  There is no swelling under the tongue.  The airway is patent, and the uvula is in the midline.  There are no temperature changes of the side of the face or the right or left mandible area.  There is no trismus appreciated.  There is mild to moderate nasal congestion present.    Right Ear: Tympanic membrane and external ear normal.     Left Ear: Tympanic membrane and external ear normal.     Nose: Congestion present.  Eyes:     General: Lids are normal.     Pupils: Pupils are equal, round, and reactive to light.  Neck:     Musculoskeletal: Normal range of motion and neck supple.     Vascular: No carotid bruit.  Cardiovascular:     Rate and Rhythm: Normal rate and regular rhythm.     Pulses: Normal pulses.     Heart sounds: Normal heart sounds.  Pulmonary:     Effort: No respiratory distress.     Breath sounds: Normal breath sounds.  Abdominal:     General: Bowel sounds are normal.     Palpations: Abdomen is soft.     Tenderness: There is no abdominal tenderness. There is no guarding.  Musculoskeletal: Normal range of motion.  Lymphadenopathy:     Head:     Right side of head: No submandibular adenopathy.     Left side of head: No submandibular adenopathy.     Cervical: No cervical adenopathy.  Skin:    General: Skin is warm and dry.  Neurological:     Mental Status: She is alert and oriented to person, place, and time.     Cranial Nerves: No cranial nerve deficit.     Sensory: No sensory deficit.  Psychiatric:        Speech: Speech normal.      ED Treatments /  Results  Labs (all labs ordered are listed, but only abnormal results are displayed) Labs Reviewed - No data  to display  EKG None  Radiology No results found.  Procedures Procedures (including critical care time)  Medications Ordered in ED Medications - No data to display   Initial Impression / Assessment and Plan / ED Course  I have reviewed the triage vital signs and the nursing notes.  Pertinent labs & imaging results that were available during my care of the patient were reviewed by me and considered in my medical decision making (see chart for details).          Final Clinical Impressions(s) / ED Diagnoses MDM  Vital signs reviewed.  Pulse oximetry is 98% on room air.  Within normal limits by my interpretation.  The patient has swelling at the right lower premolar and molar area.  She says this is the area that is given her pain.  There is no evidence for Ludewig's angina or other emergent situation.  The patient states that she has been having some body aches and some congestion, but she has been able to keep these under some control with over-the-counter medications.  The patient will be treated with Amoxil 3 times daily.  She is asked to use 600 mg of ibuprofen with each meal and at bedtime.  She is asked to use Tylenol extra strength in between the ibuprofen doses.  I have strongly suggested to the patient that she see a dentist as soon as possible.  The patient acknowledges understanding of these instructions. The patient will return to the ED if any changes or problem.   Final diagnoses:  Pain, dental  Viral upper respiratory tract infection    ED Discharge Orders         Ordered    amoxicillin (AMOXIL) 500 MG capsule  3 times daily     07/30/18 1846           Ivery QualeBryant, Emmaclaire Switala, PA-C 07/31/18 0000    Jacalyn LefevreHaviland, Julie, MD 07/31/18 1549

## 2018-07-30 NOTE — Discharge Instructions (Addendum)
Please increase fluids.  Please wash hands frequently.  Wipe off surfaces as needed.  Use your mask until the upper respiratory symptoms have resolved.  You have some swelling of the right lower gum area.  Your airway is open.  Please see your dentist as soon as possible concerning this dental issue.  Please use Amoxil 3 times daily.  Please use 600 mg of ibuprofen with each meal and at bedtime.  May use Tylenol extra strength in between the ibuprofen doses if needed for pain and discomfort.

## 2018-07-30 NOTE — ED Triage Notes (Signed)
Patient complains of general body aches, and fever x 4 days. Patient states abscess to right lower jaw that come up today.

## 2018-08-07 ENCOUNTER — Other Ambulatory Visit: Payer: Self-pay

## 2018-08-07 ENCOUNTER — Encounter (HOSPITAL_COMMUNITY): Payer: Self-pay

## 2018-08-07 ENCOUNTER — Emergency Department (HOSPITAL_COMMUNITY)
Admission: EM | Admit: 2018-08-07 | Discharge: 2018-08-07 | Disposition: A | Discharge status: Home / Self Care | Payer: Self-pay | Attending: Emergency Medicine | Admitting: Emergency Medicine

## 2018-08-07 DIAGNOSIS — K0889 Other specified disorders of teeth and supporting structures: Secondary | ICD-10-CM

## 2018-08-07 DIAGNOSIS — K047 Periapical abscess without sinus: Secondary | ICD-10-CM | POA: Insufficient documentation

## 2018-08-07 DIAGNOSIS — I10 Essential (primary) hypertension: Secondary | ICD-10-CM | POA: Insufficient documentation

## 2018-08-07 DIAGNOSIS — Z79899 Other long term (current) drug therapy: Secondary | ICD-10-CM | POA: Insufficient documentation

## 2018-08-07 DIAGNOSIS — F17210 Nicotine dependence, cigarettes, uncomplicated: Secondary | ICD-10-CM | POA: Insufficient documentation

## 2018-08-07 MED ORDER — TRAMADOL HCL 50 MG PO TABS: 50.0000 mg | ORAL_TABLET | Freq: Four times a day (QID) | ORAL | 0 refills | Status: AC | PRN

## 2018-08-07 MED ORDER — ONDANSETRON HCL 4 MG PO TABS
4.0000 mg | ORAL_TABLET | Freq: Once | ORAL | Status: AC
  Administered 2018-08-07: 4 mg via ORAL
  Filled 2018-08-07: qty 1

## 2018-08-07 MED ORDER — IBUPROFEN 800 MG PO TABS
800.0000 mg | ORAL_TABLET | Freq: Once | ORAL | Status: AC
  Administered 2018-08-07: 800 mg via ORAL
  Filled 2018-08-07: qty 1

## 2018-08-07 MED ORDER — CLINDAMYCIN HCL 150 MG PO CAPS: ORAL_CAPSULE | ORAL | 0 refills | Status: DC

## 2018-08-07 MED ORDER — CLINDAMYCIN HCL 150 MG PO CAPS
300.0000 mg | ORAL_CAPSULE | Freq: Once | ORAL | Status: AC
Start: 2018-08-07 — End: 2018-08-07
  Administered 2018-08-07: 300 mg via ORAL
  Filled 2018-08-07: qty 2

## 2018-08-07 MED ORDER — TRAMADOL HCL 50 MG PO TABS
100.0000 mg | ORAL_TABLET | Freq: Once | ORAL | Status: AC
  Administered 2018-08-07: 100 mg via ORAL
  Filled 2018-08-07: qty 2

## 2018-08-07 NOTE — ED Triage Notes (Signed)
Pt with complaints dental abscess to right lower side. Pt states she was seen on 07/30/18 and given an antibiotic then.

## 2018-08-07 NOTE — ED Provider Notes (Signed)
[facility] EMERGENCY DEPARTMENT Provider Note   CSN: [id number] Arrival date & time: 08/07/18  1155    History   Chief Complaint Chief Complaint  Patient presents with  . Dental Pain    HPI Chloe [name] is a 43 y.o. female.     Patient is a 43 year old female who presents to the emergency department with a complaint of dental pain.  The patient states she continues to have right lower dental pain and gum pain.  The patient was seen in the emergency department approximately 2 weeks ago with same pain.  She was placed on antibiotics.  The patient states that the infection is still present, and the dentist will not pull the tooth until the infection has resolved.  She presents now to see if she can get a different antibiotic so that she can move forward with dental plan.  No fever reported.  No nausea vomiting reported.  No difficulty with swallowing or speaking.  The history is provided by the patient.    Past Medical History:  Diagnosis Date  . Hypertension   . Kidney stones     There are no active problems to display for this patient.   Past Surgical History:  Procedure Laterality Date  . ABDOMINAL HYSTERECTOMY    . CHOLECYSTECTOMY    . EYE SURGERY    . NECK SURGERY       OB History    Gravida  3   Para  3   Term  1   Preterm  2   AB      Living  3     SAB      TAB      Ectopic      Multiple      Live Births               Home Medications    Prior to Admission medications   Medication Sig Start Date End Date Taking? Authorizing Provider  amoxicillin (AMOXIL) 500 MG capsule Take 1 capsule (500 mg total) by mouth 3 (three) times daily. 07/30/18   Ivery Quale, PA-C  diazepam (VALIUM) 10 MG tablet Take 5 mg by mouth once.    [provider]  ondansetron (ZOFRAN ODT) 8 MG disintegrating tablet Take 1 tablet (8 mg total) by mouth every 8 (eight) hours as needed for nausea or vomiting. 09/21/16   Idol, Raynelle Fanning, PA-C    oxyCODONE-acetaminophen (PERCOCET/ROXICET) 5-325 MG tablet Take 1 tablet by mouth every 4 (four) hours as needed. 09/21/16   Burgess Amor, PA-C  ranitidine (ZANTAC) 150 MG tablet Take 150 mg by mouth daily as needed for heartburn (stomach pain).    [provider]  tamsulosin (FLOMAX) 0.4 MG CAPS capsule Take 1 capsule (0.4 mg total) by mouth daily after supper. 09/21/16   Burgess Amor, PA-C    Family History Family History  Problem Relation Age of Onset  . Cancer Father     Social History Social History   Tobacco Use  . Smoking status: Current Every Day Smoker    Packs/day: 1.00    Years: 15.00    Pack years: 15.00    Types: Cigarettes  . Smokeless tobacco: Never Used  Substance Use Topics  . Alcohol use: No  . Drug use: Yes    Types: Marijuana    Comment: occasionally     Allergies   Patient has no known allergies.   Review of Systems Review of Systems  Constitutional: Negative for activity change.  All ROS Neg except as noted in HPI  HENT: Positive for dental problem. Negative for nosebleeds.   Eyes: Negative for photophobia and discharge.  Respiratory: Negative for cough, shortness of breath and wheezing.   Cardiovascular: Negative for chest pain and palpitations.  Gastrointestinal: Negative for abdominal pain and blood in stool.  Genitourinary: Negative for dysuria, frequency and hematuria.  Musculoskeletal: Negative for arthralgias, back pain and neck pain.  Skin: Negative.   Neurological: Negative for dizziness, seizures and speech difficulty.  Psychiatric/Behavioral: Negative for confusion and hallucinations.     Physical Exam Updated Vital Signs BP 123/86 (BP Location: Right Arm)   Pulse 71   Temp 97.9 F (36.6 C) (Temporal)   Resp 18   Ht 5\' 5"  (1.651 m)   Wt 120.2 kg   SpO2 100%   BMI 44.10 kg/m   Physical Exam Vitals signs and nursing note reviewed.  Constitutional:      Appearance: She is well-developed. She is not  toxic-appearing.  HENT:     Head: Normocephalic.     Right Ear: Tympanic membrane and external ear normal.     Left Ear: Tympanic membrane and external ear normal.     Mouth/Throat:     Comments: There is swelling of the gum near the right lower molar.  There appears to be a small abscess present.  There is no swelling under the tongue.  The airway is patent.  The uvula is in the midline. Eyes:     General: Lids are normal.     Pupils: Pupils are equal, round, and reactive to light.  Neck:     Musculoskeletal: Normal range of motion and neck supple.     Vascular: No carotid bruit.  Cardiovascular:     Rate and Rhythm: Normal rate and regular rhythm.     Pulses: Normal pulses.     Heart sounds: Normal heart sounds.  Pulmonary:     Effort: No respiratory distress.     Breath sounds: Normal breath sounds.  Abdominal:     General: Bowel sounds are normal.     Palpations: Abdomen is soft.     Tenderness: There is no abdominal tenderness. There is no guarding.  Musculoskeletal: Normal range of motion.  Lymphadenopathy:     Head:     Right side of head: No submandibular adenopathy.     Left side of head: No submandibular adenopathy.     Cervical: No cervical adenopathy.  Skin:    General: Skin is warm and dry.  Neurological:     Mental Status: She is alert and oriented to person, place, and time.     Cranial Nerves: No cranial nerve deficit.     Sensory: No sensory deficit.  Psychiatric:        Speech: Speech normal.      ED Treatments / Results  Labs (all labs ordered are listed, but only abnormal results are displayed) Labs Reviewed - No data to display  EKG None  Radiology No results found.  Procedures Procedures (including critical care time)  Medications Ordered in ED Medications - No data to display   Initial Impression / Assessment and Plan / ED Course  I have reviewed the triage vital signs and the nursing notes.  Pertinent labs & imaging results that  were available during my care of the patient were reviewed by me and considered in my medical decision making (see chart for details).          Final Clinical  Impressions(s) / ED Diagnoses MDM  Patient was seen in the emergency department approximately 2 weeks ago with similar to the pain.  She was told by the dentist that they could not pull it until the infection at resolved.  I discussed with the patient trying a different antibiotic and moving from Amoxil, to clindamycin.  Prescription for clindamycin was given to the patient.  Patient was also given some medication to help with pain.  I attempted to move the patient from her current room to her room with suction to attempt to drain the abscess and possibly speed the process of resolving this infection along.  The patient was upset at the length of time it was taking to get the room set up and she really desires to leave at this time.  Patient invited to return to the emergency department if any changes in her condition, problems, or concerns.   Final diagnoses:  Dental abscess  Pain, dental    ED Discharge Orders         Ordered    clindamycin (CLEOCIN) 150 MG capsule     08/07/18 1609    traMADol (ULTRAM) 50 MG tablet  Every 6 hours PRN     08/07/18 1609           Ivery Quale, PA-C 08/07/18 1615    Donnetta Hutching, MD 08/07/18 2203

## 2018-08-07 NOTE — Discharge Instructions (Signed)
Please use clindamycin 2 tablets twice daily.  Use ibuprofen and ultram for pain  See your dentist as soon as possible.

## 2019-01-03 ENCOUNTER — Other Ambulatory Visit: Payer: Medicaid Other

## 2019-01-03 DIAGNOSIS — Z20822 Contact with and (suspected) exposure to covid-19: Secondary | ICD-10-CM

## 2019-01-06 LAB — NOVEL CORONAVIRUS, NAA: SARS-CoV-2, NAA: NOT DETECTED

## 2019-08-01 ENCOUNTER — Emergency Department (HOSPITAL_COMMUNITY): Payer: Self-pay

## 2019-08-01 ENCOUNTER — Other Ambulatory Visit: Payer: Self-pay

## 2019-08-01 ENCOUNTER — Encounter (HOSPITAL_COMMUNITY): Payer: Self-pay | Admitting: Emergency Medicine

## 2019-08-01 ENCOUNTER — Emergency Department (HOSPITAL_COMMUNITY)
Admission: EM | Admit: 2019-08-01 | Discharge: 2019-08-01 | Disposition: A | Discharge status: Home / Self Care | Payer: Self-pay | Attending: Emergency Medicine | Admitting: Emergency Medicine

## 2019-08-01 DIAGNOSIS — Z5321 Procedure and treatment not carried out due to patient leaving prior to being seen by health care provider: Secondary | ICD-10-CM | POA: Insufficient documentation

## 2019-08-01 DIAGNOSIS — R0789 Other chest pain: Secondary | ICD-10-CM

## 2019-08-01 DIAGNOSIS — R519 Headache, unspecified: Secondary | ICD-10-CM | POA: Insufficient documentation

## 2019-08-01 DIAGNOSIS — R202 Paresthesia of skin: Secondary | ICD-10-CM | POA: Insufficient documentation

## 2019-08-01 NOTE — ED Triage Notes (Signed)
Patient states she had a heart attack Wednesday night but did not call ems. Patient  States she has tingling in her right arm and the left side of her head aches.

## 2019-08-05 ENCOUNTER — Other Ambulatory Visit: Payer: Self-pay

## 2019-08-05 ENCOUNTER — Emergency Department (HOSPITAL_COMMUNITY)
Admission: EM | Admit: 2019-08-05 | Discharge: 2019-08-05 | Disposition: A | Discharge status: Home / Self Care | Payer: Medicaid Other | Attending: Emergency Medicine | Admitting: Emergency Medicine

## 2019-08-05 ENCOUNTER — Encounter (HOSPITAL_COMMUNITY): Payer: Self-pay | Admitting: Emergency Medicine

## 2019-08-05 DIAGNOSIS — R200 Anesthesia of skin: Secondary | ICD-10-CM | POA: Insufficient documentation

## 2019-08-05 DIAGNOSIS — I10 Essential (primary) hypertension: Secondary | ICD-10-CM | POA: Insufficient documentation

## 2019-08-05 DIAGNOSIS — F17210 Nicotine dependence, cigarettes, uncomplicated: Secondary | ICD-10-CM | POA: Insufficient documentation

## 2019-08-05 DIAGNOSIS — Z7982 Long term (current) use of aspirin: Secondary | ICD-10-CM | POA: Insufficient documentation

## 2019-08-05 LAB — COMPREHENSIVE METABOLIC PANEL
ALT: 55 U/L — ABNORMAL HIGH (ref 0–44)
AST: 29 U/L (ref 15–41)
Albumin: 3.7 g/dL (ref 3.5–5.0)
Alkaline Phosphatase: 87 U/L (ref 38–126)
Anion gap: 10 (ref 5–15)
BUN: 14 mg/dL (ref 6–20)
CO2: 27 mmol/L (ref 22–32)
Calcium: 9.4 mg/dL (ref 8.9–10.3)
Chloride: 101 mmol/L (ref 98–111)
Creatinine, Ser: 0.63 mg/dL (ref 0.44–1.00)
GFR calc Af Amer: >60 mL/min (ref >60)
GFR calc non Af Amer: >60 mL/min (ref >60)
Glucose, Bld: 148 mg/dL — ABNORMAL HIGH (ref 70–99)
Potassium: 4.3 mmol/L (ref 3.5–5.1)
Sodium: 138 mmol/L (ref 135–145)
Total Bilirubin: 0.4 mg/dL (ref 0.3–1.2)
Total Protein: 6.7 g/dL (ref 6.5–8.1)

## 2019-08-05 LAB — TSH: TSH: 1.868 u[IU]/mL (ref 0.350–4.500)

## 2019-08-05 LAB — CBC WITH DIFFERENTIAL/PLATELET
Abs Immature Granulocytes: 0.03 10*3/uL (ref 0.00–0.07)
Basophils Absolute: 0.1 10*3/uL (ref 0.0–0.1)
Basophils Relative: 1 %
Eosinophils Absolute: 0.9 10*3/uL — ABNORMAL HIGH (ref 0.0–0.5)
Eosinophils Relative: 10 %
HCT: 47 % — ABNORMAL HIGH (ref 36.0–46.0)
Hemoglobin: 15.4 g/dL — ABNORMAL HIGH (ref 12.0–15.0)
Immature Granulocytes: 0 %
Lymphocytes Relative: 35 %
Lymphs Abs: 3.4 10*3/uL (ref 0.7–4.0)
MCH: 31.5 pg (ref 26.0–34.0)
MCHC: 32.8 g/dL (ref 30.0–36.0)
MCV: 96.1 fL (ref 80.0–100.0)
Monocytes Absolute: 0.5 10*3/uL (ref 0.1–1.0)
Monocytes Relative: 5 %
Neutro Abs: 4.7 10*3/uL (ref 1.7–7.7)
Neutrophils Relative %: 49 %
Platelets: 195 10*3/uL (ref 150–400)
RBC: 4.89 MIL/uL (ref 3.87–5.11)
RDW: 12.4 % (ref 11.5–15.5)
WBC: 9.6 10*3/uL (ref 4.0–10.5)
nRBC: 0 % (ref 0.0–0.2)

## 2019-08-05 LAB — TROPONIN I (HIGH SENSITIVITY)
Troponin I (High Sensitivity): 3 ng/L (ref <18)
Troponin I (High Sensitivity): 3 ng/L (ref <18)

## 2019-08-05 LAB — D-DIMER, QUANTITATIVE: D-Dimer, Quant: 0.43 ug/mL-FEU (ref 0.00–0.50)

## 2019-08-05 LAB — BRAIN NATRIURETIC PEPTIDE: B Natriuretic Peptide: 29.3 pg/mL (ref 0.0–100.0)

## 2019-08-05 NOTE — Discharge Instructions (Addendum)
After completing several tests to check for heart causes of your symptoms, it looks as though it was not involved. Due to your risk factors for heart disease including a strong family history, I would strongly recommend you see a PCP. Social work here will contact you to help set that up.   Follow up appointment tomorrow on [address] at 2pm in [place]

## 2019-08-05 NOTE — ED Provider Notes (Signed)
[facility] EMERGENCY DEPARTMENT Provider Note   CSN: [id number] Arrival date & time: 08/05/19  0935     History Chief Complaint  Patient presents with  . Numbness    Chloe [name] is a 44 y.o. female.  44 year old female without significant past medical history likely due to lack of contact with primary care for several years.  Patient woke up 2 AM 6 days ago with sharp substernal chest pain and right arm numbness and tingling which resolved spontaneously and she did not seek help at that time but is convinced that she had a heart attack due to her parents having heart issues.  Presented to [facility] ED 4 days ago for work-up and received EKG which was normal but eloped prior to being seen by provider.  Today, patient presents to emergency department without chest pain or shortness of breath.  She endorses feeling extremely fatigued since onset of symptoms 6 days ago as well as continued numbness and tingling of her right arm.  Denies nausea, vomiting, diaphoresis, chest pain, shortness of breath, leg swelling, palpitations.  She endorses history of hypertension but takes no medications and has had normal blood pressure today and at prior ED visit.    Past Medical History:  Diagnosis Date  . Hypertension   . Kidney stones     There are no problems to display for this patient.   Past Surgical History:  Procedure Laterality Date  . ABDOMINAL HYSTERECTOMY    . CHOLECYSTECTOMY    . EYE SURGERY    . NECK SURGERY       OB History    Gravida  3   Para  3   Term  1   Preterm  2   AB      Living  3     SAB      TAB      Ectopic      Multiple      Live Births              Family History  Problem Relation Age of Onset  . Cancer Father     Social History   Tobacco Use  . Smoking status: Current Every Day Smoker    Packs/day: 1.00    Years: 15.00    Pack years: 15.00    Types: Cigarettes  . Smokeless tobacco: Never Used    Substance Use Topics  . Alcohol use: No  . Drug use: Yes    Types: Marijuana    Comment: occasionally    Home Medications Prior to Admission medications   Medication Sig Start Date End Date Taking? Authorizing Provider  aspirin EC 81 MG tablet Take 81 mg by mouth daily.   Yes [provider]  ondansetron (ZOFRAN ODT) 8 MG disintegrating tablet Take 1 tablet (8 mg total) by mouth every 8 (eight) hours as needed for nausea or vomiting. 09/21/16  Yes Idol, Raynelle Fanning, PA-C  oxyCODONE-acetaminophen (PERCOCET/ROXICET) 5-325 MG tablet Take 1 tablet by mouth every 4 (four) hours as needed. Patient not taking: Reported on 08/05/2019 09/21/16   Burgess Amor, PA-C  tamsulosin (FLOMAX) 0.4 MG CAPS capsule Take 1 capsule (0.4 mg total) by mouth daily after supper. Patient not taking: Reported on 08/05/2019 09/21/16   Burgess Amor, PA-C  traMADol (ULTRAM) 50 MG tablet Take 1 tablet (50 mg total) by mouth every 6 (six) hours as needed. Patient not taking: Reported on 08/05/2019 08/07/18   Ivery Quale, PA-C  Allergies    Patient has no known allergies.  Review of Systems   Review of Systems  Constitutional: Positive for activity change and fatigue. Negative for fever.  HENT: Negative.   Eyes: Negative.   Respiratory: Positive for shortness of breath (Not currently but is presently when she lays flat on her back). Negative for cough.   Cardiovascular: Negative for chest pain, palpitations and leg swelling.  Gastrointestinal: Negative for abdominal pain, diarrhea, nausea and vomiting.  Skin: Negative.   Neurological: Positive for numbness (right arm) and headaches. Negative for weakness.    Physical Exam Updated Vital Signs BP 113/60 (BP Location: Right Arm)   Pulse 70   Temp 98.6 F (37 C) (Oral)   Resp 15   Ht 5\' 5"  (1.651 m)   Wt 133.8 kg   SpO2 96%   BMI 49.09 kg/m   Physical Exam Vitals and nursing note reviewed.  Constitutional:      General: She is not in acute distress.     Appearance: She is obese. She is not ill-appearing (appears fatigued).     Comments: Sitting in fowlers position  HENT:     Head: Normocephalic and atraumatic.  Eyes:     Comments: Left pupil constricted and reactive to light. 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     Pulses: Normal pulses.     Heart sounds: Normal heart sounds.     Comments: Negative tenderness to palpation of chest wall Pulmonary:     Effort: Pulmonary effort is normal. No respiratory distress.     Breath sounds: Normal breath sounds. Decreased air movement present.  Abdominal:     Palpations: Abdomen is soft.     Tenderness: There is no abdominal tenderness.     Comments: Protuberant   Musculoskeletal:     Cervical back: Normal range of motion and neck supple. No tenderness.  Lymphadenopathy:     Cervical: No cervical adenopathy.  Skin:    General: Skin is warm and dry.  Neurological:     General: No focal deficit present.     Mental Status: She is alert.  Psychiatric:        Mood and Affect: Mood normal.    ED Results / Procedures / Treatments   Labs (all labs ordered are listed, but only abnormal results are displayed) Labs Reviewed  CBC WITH DIFFERENTIAL/PLATELET - Abnormal; Notable for the following components:      Result Value   Hemoglobin 15.4 (*)    HCT 47.0 (*)    Eosinophils Absolute 0.9 (*)    All other components within normal limits  COMPREHENSIVE METABOLIC PANEL - Abnormal; Notable for the following components:   Glucose, Bld 148 (*)    ALT 55 (*)    All other components within normal limits  BRAIN NATRIURETIC PEPTIDE  D-DIMER, QUANTITATIVE (NOT AT [facility])  TSH  TROPONIN I (HIGH SENSITIVITY)  TROPONIN I (HIGH SENSITIVITY)    EKG EKG Interpretation  Date/Time:  Tuesday August 05 2019 09:39:52 EST Ventricular Rate:  81 PR Interval:  162 QRS Duration: 82 QT Interval:  360 QTC  Calculation: 418 R Axis:   79 Text Interpretation: Normal sinus rhythm Normal ECG Confirmed by [id number] (Gwyneth Sprout) on 08/05/2019 11:01:13 AM   Radiology No results found.  Procedures Procedures (including critical care time)  Medications Ordered in ED Medications - No data to display  ED Course  I have reviewed the triage vital signs and the nursing notes.  Pertinent labs & imaging results that were available during my care of the patient were reviewed by me and considered in my medical decision making (see chart for details).   MDM Rules/Calculators/A&P                     Symptoms atypical of specific cause. Her symptoms at home could have been GERD, musculoskeletal, but unlikely ACS with normal ECG and negative troponins x2 and reassuring vital signs. No chest pain/SOB on presentation. Patient made aware and is relieved.  Patient would benefit from establishing primary care and likely cardiology given high risk FHx and other risk factors of heart disease. Social work consulted for PCP set up assistance and made appointment for tomorrow. Discharged for close follow up.  Final Clinical Impression(s) / ED Diagnoses Final diagnoses:  Numbness    Rx / DC Orders ED Discharge Orders    None       Richarda Osmond, DO 08/05/19 [place], MD 08/05/19 1701

## 2019-08-05 NOTE — ED Triage Notes (Signed)
Pt states that Wednesday night she woke up with chest pain and R arm tingling, went to [facility] a few days ago and had labs and ekg but never saw provider. Chest pain subsided but pt is still having continued numbness/tingling to R Hand along with some associated weakness in same hand. Pt a/ox4, resp e/u, speech clear, face symmetrical. Limbs symmetrical with no drift.

## 2019-08-05 NOTE — ED Notes (Signed)
Patient verbalizes understanding of discharge instructions . Opportunity for questions and answers were provided . Armband removed by staff ,Pt discharged from ED. W/C  offered at D/C  and Declined W/C at D/C and was escorted to lobby by RN.  

## 2019-08-05 NOTE — ED Notes (Signed)
PT waiting for SW

## 2020-08-09 ENCOUNTER — Other Ambulatory Visit: Payer: Self-pay

## 2020-08-09 ENCOUNTER — Emergency Department (HOSPITAL_COMMUNITY)
Admission: EM | Admit: 2020-08-09 | Discharge: 2020-08-09 | Disposition: A | Discharge status: Home / Self Care | Payer: Medicaid Other | Attending: Emergency Medicine | Admitting: Emergency Medicine

## 2020-08-09 ENCOUNTER — Encounter (HOSPITAL_COMMUNITY): Payer: Self-pay

## 2020-08-09 DIAGNOSIS — Z5321 Procedure and treatment not carried out due to patient leaving prior to being seen by health care provider: Secondary | ICD-10-CM | POA: Insufficient documentation

## 2020-08-09 DIAGNOSIS — R109 Unspecified abdominal pain: Secondary | ICD-10-CM | POA: Insufficient documentation

## 2020-08-09 NOTE — ED Triage Notes (Signed)
Pt here pov from home with cc of abdominal pain. [place] it started 7 years ago . Was told it was probaby cancer but never followed up . Said that over the last 3 months her pain has came back. Her daughter was just diagnosed with a hereditary form of abdominal cancer.

## 2022-02-10 ENCOUNTER — Other Ambulatory Visit: Payer: Self-pay

## 2022-02-10 ENCOUNTER — Encounter (HOSPITAL_COMMUNITY): Payer: Self-pay | Admitting: Emergency Medicine

## 2022-02-10 ENCOUNTER — Emergency Department (HOSPITAL_COMMUNITY)
Admission: EM | Admit: 2022-02-10 | Discharge: 2022-02-10 | Disposition: A | Discharge status: Home / Self Care | Payer: Self-pay | Attending: Emergency Medicine | Admitting: Emergency Medicine

## 2022-02-10 DIAGNOSIS — R110 Nausea: Secondary | ICD-10-CM | POA: Insufficient documentation

## 2022-02-10 DIAGNOSIS — T7805XA Anaphylactic reaction due to tree nuts and seeds, initial encounter: Secondary | ICD-10-CM | POA: Insufficient documentation

## 2022-02-10 DIAGNOSIS — R1310 Dysphagia, unspecified: Secondary | ICD-10-CM | POA: Insufficient documentation

## 2022-02-10 DIAGNOSIS — R220 Localized swelling, mass and lump, head: Secondary | ICD-10-CM | POA: Insufficient documentation

## 2022-02-10 DIAGNOSIS — T7840XA Allergy, unspecified, initial encounter: Secondary | ICD-10-CM

## 2022-02-10 DIAGNOSIS — Z7982 Long term (current) use of aspirin: Secondary | ICD-10-CM | POA: Insufficient documentation

## 2022-02-10 DIAGNOSIS — L509 Urticaria, unspecified: Secondary | ICD-10-CM | POA: Insufficient documentation

## 2022-02-10 LAB — CBC WITH DIFFERENTIAL/PLATELET
Abs Immature Granulocytes: 0.01 10*3/uL (ref 0.00–0.07)
Basophils Absolute: 0 10*3/uL (ref 0.0–0.1)
Basophils Relative: 0 %
Eosinophils Absolute: 0.2 10*3/uL (ref 0.0–0.5)
Eosinophils Relative: 3 %
HCT: 44.9 % (ref 36.0–46.0)
Hemoglobin: 15.2 g/dL — ABNORMAL HIGH (ref 12.0–15.0)
Immature Granulocytes: 0 %
Lymphocytes Relative: 33 %
Lymphs Abs: 2.7 10*3/uL (ref 0.7–4.0)
MCH: 31.3 pg (ref 26.0–34.0)
MCHC: 33.9 g/dL (ref 30.0–36.0)
MCV: 92.6 fL (ref 80.0–100.0)
Monocytes Absolute: 0.7 10*3/uL (ref 0.1–1.0)
Monocytes Relative: 8 %
Neutro Abs: 4.7 10*3/uL (ref 1.7–7.7)
Neutrophils Relative %: 56 %
Platelets: 197 10*3/uL (ref 150–400)
RBC: 4.85 MIL/uL (ref 3.87–5.11)
RDW: 12.5 % (ref 11.5–15.5)
WBC: 8.3 10*3/uL (ref 4.0–10.5)
nRBC: 0 % (ref 0.0–0.2)

## 2022-02-10 LAB — BASIC METABOLIC PANEL
Anion gap: 9 (ref 5–15)
BUN: 16 mg/dL (ref 6–20)
CO2: 23 mmol/L (ref 22–32)
Calcium: 8.9 mg/dL (ref 8.9–10.3)
Chloride: 105 mmol/L (ref 98–111)
Creatinine, Ser: 0.62 mg/dL (ref 0.44–1.00)
GFR, Estimated: >60 mL/min (ref >60)
Glucose, Bld: 101 mg/dL — ABNORMAL HIGH (ref 70–99)
Potassium: 3.9 mmol/L (ref 3.5–5.1)
Sodium: 137 mmol/L (ref 135–145)

## 2022-02-10 MED ORDER — FAMOTIDINE IN NACL 20-0.9 MG/50ML-% IV SOLN
20.0000 mg | Freq: Once | INTRAVENOUS | Status: AC
  Administered 2022-02-10: 20 mg via INTRAVENOUS
  Filled 2022-02-10: qty 50

## 2022-02-10 MED ORDER — DIPHENHYDRAMINE HCL 50 MG/ML IJ SOLN
25.0000 mg | Freq: Once | INTRAMUSCULAR | Status: AC
  Administered 2022-02-10: 25 mg via INTRAVENOUS
  Filled 2022-02-10: qty 1

## 2022-02-10 MED ORDER — METHYLPREDNISOLONE SODIUM SUCC 125 MG IJ SOLR
125.0000 mg | Freq: Once | INTRAMUSCULAR | Status: AC
  Administered 2022-02-10: 125 mg via INTRAVENOUS
  Filled 2022-02-10: qty 2

## 2022-02-10 MED ORDER — PREDNISONE 20 MG PO TABS: 40.0000 mg | ORAL_TABLET | Freq: Every day | ORAL | 0 refills | Status: AC

## 2022-02-10 NOTE — ED Provider Notes (Signed)
[facility] EMERGENCY DEPARTMENT Provider Note   CSN: [id number] Arrival date & time: 02/10/22  6789     History {Add pertinent medical, surgical, social history, OB history to HPI:1} Chief Complaint  Patient presents with  . Allergic Reaction    Chloe [name] is a 46 y.o. female.   Allergic Reaction Presenting symptoms: difficulty swallowing and rash   Presenting symptoms: no wheezing        Chloe [name] is a 46 y.o. female who presents to the Emergency Department complaining of generalized itching and hives.  She states that she ate a bag of mixed nuts 3 days ago.  Shortly after eating the nuts, she noticed itching to both lower legs.  She began having "whelps" to her legs that spread to her trunk and face.  Has had some itching of her arms as well.  She denies any prior nut allergies, new medications or chemical exposures.  This morning, she noted having some swelling of her lower lip and had some difficulty swallowing while drinking coffee.  She has been taking 2 Benadryl every 4-6 hours with minimal relief.  She noticed swelling around her eyes yesterday.  Symptoms have been associated with dry heaves and nausea.  She denies any vomiting, swelling of her tongue or shortness of breath.  Denies any exposures to bedbugs.  Home Medications Prior to Admission medications   Medication Sig Start Date End Date Taking? Authorizing Provider  aspirin EC 81 MG tablet Take 81 mg by mouth daily.    [provider]  ondansetron (ZOFRAN ODT) 8 MG disintegrating tablet Take 1 tablet (8 mg total) by mouth every 8 (eight) hours as needed for nausea or vomiting. 09/21/16   Idol, Raynelle Fanning, PA-C  oxyCODONE-acetaminophen (PERCOCET/ROXICET) 5-325 MG tablet Take 1 tablet by mouth every 4 (four) hours as needed. Patient not taking: Reported on 08/05/2019 09/21/16   Burgess Amor, PA-C  tamsulosin (FLOMAX) 0.4 MG CAPS capsule Take 1 capsule (0.4 mg total) by mouth daily after supper. Patient not  taking: Reported on 08/05/2019 09/21/16   Burgess Amor, PA-C  traMADol (ULTRAM) 50 MG tablet Take 1 tablet (50 mg total) by mouth every 6 (six) hours as needed. Patient not taking: Reported on 08/05/2019 08/07/18   Ivery Quale, PA-C      Allergies    Hydrocodone    Review of Systems   Review of Systems  Constitutional:  Negative for appetite change and fever.  HENT:  Positive for facial swelling and trouble swallowing.   Eyes:  Negative for visual disturbance.  Respiratory:  Negative for cough, chest tightness, shortness of breath and wheezing.   Cardiovascular:  Negative for chest pain and leg swelling.  Gastrointestinal:  Positive for nausea. Negative for abdominal pain and diarrhea.  Genitourinary:  Negative for difficulty urinating.  Musculoskeletal:  Negative for arthralgias and myalgias.  Skin:  Positive for color change and rash.  Neurological:  Negative for dizziness, seizures, weakness, numbness and headaches.    Physical Exam Updated Vital Signs BP 139/67   Pulse 64   Temp 97.9 F (36.6 C) (Oral)   Resp 19   Ht 5\' 4"  (1.626 m)   Wt 100.7 kg   SpO2 98%   BMI 38.11 kg/m  Physical Exam Vitals and nursing note reviewed.  Constitutional:      General: She is not in acute distress.    Appearance: Normal appearance. She is not toxic-appearing.  HENT:     Mouth/Throat:     Mouth:  Mucous membranes are moist.     Pharynx: Oropharynx is clear.     Comments: Mild edema noted of the lower lip.  No edema or erythema of the oropharynx.  No oral lesions noted.  Uvula midline nonedematous.  No edema noted of the tongue. Eyes:     Conjunctiva/sclera: Conjunctivae normal.     Comments: Irregular right pupil at baseline, secondary to childhood injury  Cardiovascular:     Rate and Rhythm: Normal rate and regular rhythm.     Pulses: Normal pulses.  Pulmonary:     Effort: Pulmonary effort is normal. No respiratory distress.     Breath sounds: Normal breath sounds.  Abdominal:      Palpations: Abdomen is soft.     Tenderness: There is no abdominal tenderness.  Musculoskeletal:        General: No swelling.     Right lower leg: No edema.     Left lower leg: No edema.  Skin:    General: Skin is warm.     Capillary Refill: Capillary refill takes less than 2 seconds.     Findings: Rash present.  Neurological:     General: No focal deficit present.     Mental Status: She is alert.     Sensory: No sensory deficit.     Motor: No weakness.    ED Results / Procedures / Treatments   Labs (all labs ordered are listed, but only abnormal results are displayed) Labs Reviewed  BASIC METABOLIC PANEL  CBC WITH DIFFERENTIAL/PLATELET    EKG None  Radiology No results found.  Procedures Procedures  {Document cardiac monitor, telemetry assessment procedure when appropriate:1}  Medications Ordered in ED Medications  methylPREDNISolone sodium succinate (SOLU-MEDROL) 125 mg/2 mL injection 125 mg (has no administration in time range)  diphenhydrAMINE (BENADRYL) injection 25 mg (has no administration in time range)  famotidine (PEPCID) IVPB 20 mg premix (has no administration in time range)    ED Course/ Medical Decision Making/ A&P                           Medical Decision Making Amount and/or Complexity of Data Reviewed Labs: ordered.  Risk Prescription drug management.   ***  {Document critical care time when appropriate:1} {Document review of labs and clinical decision tools ie heart score, Chads2Vasc2 etc:1}  {Document your independent review of radiology images, and any outside records:1} {Document your discussion with family members, caretakers, and with consultants:1} {Document social determinants of health affecting pt's care:1} {Document your decision making why or why not admission, treatments were needed:1} Final Clinical Impression(s) / ED Diagnoses Final diagnoses:  None    Rx / DC Orders ED Discharge Orders     None

## 2022-02-10 NOTE — ED Triage Notes (Signed)
Pt was helping friend move on Tuesday of this week and started to get a rash on legs and felt like she was getting bit by something but never saw anything. Pt states rash started on legs and arms and spread to face and head. Pt states eyes and face are swelling more and the top of her head is itching. Pt states she ate a bag of mixed nuts on Tuesday when the symptoms started but has never had a nut allergy in the past. Pt took benadryl past 2 days that helped some but it the facial swelling and top of head itching is not getting any better.

## 2022-02-10 NOTE — ED Notes (Signed)
During triage pt speaking to nurse rudely refusing to answer questions and states that all answers are in the chart because she has been here before. Pt states "yall only care about these questions and not your pts, you will let people die here. I know you aren't busy there is no one in the waiting room and you let me sit there for 25 mins. Just get someone to come see me because I have been having an allergic reaction for 3 days now"

## 2022-02-10 NOTE — Discharge Instructions (Signed)
You have likely had allergic reaction.  Avoid eating any kind of nuts in the future.  Start the prednisone prescription tomorrow, continue to take Benadryl 1 to 2 capsules every 4-6 hours as needed.  Also, take Pepcid 20 mg twice a day for 4 to 5 days.  Follow-up with your primary care provider for recheck return emergency department for any new or worsening symptoms.

## 2022-02-10 NOTE — ED Notes (Signed)
Pt took benadryl 2 tablets this morning and has been for several days since itching and whelps.

## 2022-12-03 ENCOUNTER — Emergency Department (HOSPITAL_COMMUNITY)
Admission: EM | Admit: 2022-12-03 | Discharge: 2022-12-03 | Disposition: A | Discharge status: Home / Self Care | Payer: Medicaid Other | Attending: Emergency Medicine | Admitting: Emergency Medicine

## 2022-12-03 ENCOUNTER — Other Ambulatory Visit: Payer: Self-pay

## 2022-12-03 ENCOUNTER — Emergency Department (HOSPITAL_COMMUNITY): Payer: Medicaid Other

## 2022-12-03 ENCOUNTER — Encounter (HOSPITAL_COMMUNITY): Payer: Self-pay

## 2022-12-03 DIAGNOSIS — M545 Low back pain, unspecified: Secondary | ICD-10-CM | POA: Insufficient documentation

## 2022-12-03 DIAGNOSIS — S93402A Sprain of unspecified ligament of left ankle, initial encounter: Secondary | ICD-10-CM | POA: Diagnosis not present

## 2022-12-03 DIAGNOSIS — W010XXA Fall on same level from slipping, tripping and stumbling without subsequent striking against object, initial encounter: Secondary | ICD-10-CM | POA: Insufficient documentation

## 2022-12-03 DIAGNOSIS — Z7982 Long term (current) use of aspirin: Secondary | ICD-10-CM | POA: Insufficient documentation

## 2022-12-03 DIAGNOSIS — M25572 Pain in left ankle and joints of left foot: Secondary | ICD-10-CM | POA: Diagnosis present

## 2022-12-03 DIAGNOSIS — W19XXXA Unspecified fall, initial encounter: Secondary | ICD-10-CM

## 2022-12-03 MED ORDER — OXYCODONE HCL 5 MG PO TABS
5.0000 mg | ORAL_TABLET | ORAL | Status: AC
  Administered 2022-12-03: 5 mg via ORAL
  Filled 2022-12-03: qty 1

## 2022-12-03 MED ORDER — ACETAMINOPHEN 500 MG PO TABS
1000.0000 mg | ORAL_TABLET | Freq: Once | ORAL | Status: AC
  Administered 2022-12-03: 1000 mg via ORAL
  Filled 2022-12-03: qty 2

## 2022-12-03 MED ORDER — ONDANSETRON 8 MG PO TBDP: 8.0000 mg | ORAL_TABLET | Freq: Three times a day (TID) | ORAL | 0 refills | Status: AC | PRN

## 2022-12-03 MED ORDER — ONDANSETRON 4 MG PO TBDP
4.0000 mg | ORAL_TABLET | Freq: Once | ORAL | Status: AC
  Administered 2022-12-03: 4 mg via ORAL
  Filled 2022-12-03: qty 1

## 2022-12-03 MED ORDER — OXYCODONE HCL 5 MG PO TABS: 5.0000 mg | ORAL_TABLET | Freq: Four times a day (QID) | ORAL | 0 refills | Status: AC | PRN

## 2022-12-03 MED ORDER — ONDANSETRON 8 MG PO TBDP: 8.0000 mg | ORAL_TABLET | Freq: Three times a day (TID) | ORAL | 0 refills | Status: DC | PRN

## 2022-12-03 NOTE — ED Notes (Signed)
Pt returned from Radiology.

## 2022-12-03 NOTE — ED Notes (Signed)
Patient transported to X-ray 

## 2022-12-03 NOTE — ED Triage Notes (Signed)
Patient reports she fell at work at around 1900 and continued to work, but the pain continued to worsen. Reports R flank and L foot pain. Denies hitting head or LOC. Upon arrival to ER, patient is alert and oriented, ambu. Pain 7/10

## 2022-12-03 NOTE — Discharge Instructions (Addendum)
You were seen for your ankle sprain in the emergency department.   At home, please use the Aircast we have given you.  You may weight-bear as tolerated.  Use Tylenol and ibuprofen for your pain.  Use lidocaine patches for your back.  Please elevate your ankle and use ice to limit the swelling. You may also take the oxycodone we have prescribed you for any breakthrough pain that may have.  Do not take this before driving or operating heavy machinery.  Do not take this medication with alcohol.  Check your MyChart online for the results of any tests that had not resulted by the time you left the emergency department.   Follow-up with a spine doctor soon as possible about your back pain.  Please follow-up with sports medicine in 1 to 2 weeks.  Return immediately to the emergency department if you experience any concerning symptoms.  Thank you for visiting our Emergency Department. It was a pleasure taking care of you today.

## 2022-12-03 NOTE — ED Provider Notes (Signed)
[place] EMERGENCY DEPARTMENT AT [facility] Provider Note   CSN: [id number] Arrival date & time: 12/03/22  2127     History {Add pertinent medical, surgical, social history, OB history to HPI:1} Chief Complaint  Patient presents with   Chloe [name] is a 47 y.o. female.  47 year old female who presents to the emergency department with left foot and ankle pain and back pain after a fall.  Patient was at work when she tripped over something and fell.  Says that she landed on her left leg and back.  No head strike or LOC.  Not on any blood thinners.  Says that she was able to complete her shift and continue to walk but says that she is having worsening pain in the left side of her foot and left ankle.  Also has some right lower back pain as well.         Home Medications Prior to Admission medications   Medication Sig Start Date End Date Taking? Authorizing Provider  aspirin EC 81 MG tablet Take 81 mg by mouth daily.    [provider]  ondansetron (ZOFRAN ODT) 8 MG disintegrating tablet Take 1 tablet (8 mg total) by mouth every 8 (eight) hours as needed for nausea or vomiting. 09/21/16   Idol, Raynelle Fanning, PA-C  oxyCODONE-acetaminophen (PERCOCET/ROXICET) 5-325 MG tablet Take 1 tablet by mouth every 4 (four) hours as needed. Patient not taking: Reported on 08/05/2019 09/21/16   Burgess Amor, PA-C  predniSONE (DELTASONE) 20 MG tablet Take 2 tablets (40 mg total) by mouth daily. 02/10/22   Triplett, Tammy, PA-C  tamsulosin (FLOMAX) 0.4 MG CAPS capsule Take 1 capsule (0.4 mg total) by mouth daily after supper. Patient not taking: Reported on 08/05/2019 09/21/16   Burgess Amor, PA-C  traMADol (ULTRAM) 50 MG tablet Take 1 tablet (50 mg total) by mouth every 6 (six) hours as needed. Patient not taking: Reported on 08/05/2019 08/07/18   Ivery Quale, PA-C      Allergies    Hydrocodone    Review of Systems   Review of Systems  Physical Exam Updated Vital Signs BP  92/63 (BP Location: Right Arm)   Pulse 81   Temp 98 F (36.7 C) (Oral)   Resp 17   Ht 5\' 5"  (1.651 m)   Wt 117.9 kg   SpO2 92%   BMI 43.27 kg/m  Physical Exam Constitutional:      General: She is not in acute distress.    Appearance: Normal appearance. She is not ill-appearing.  HENT:     Head: Normocephalic and atraumatic.     Right Ear: External ear normal.     Left Ear: External ear normal.     Mouth/Throat:     Mouth: Mucous membranes are moist.     Pharynx: Oropharynx is clear.  Eyes:     Extraocular Movements: Extraocular movements intact.     Conjunctiva/sclera: Conjunctivae normal.     Pupils: Pupils are equal, round, and reactive to light.  Neck:     Comments: No C-spine midline tenderness to palpation Cardiovascular:     Rate and Rhythm: Normal rate and regular rhythm.     Pulses: Normal pulses.     Heart sounds: Normal heart sounds.  Pulmonary:     Effort: Pulmonary effort is normal. No respiratory distress.     Breath sounds: Normal breath sounds.  Abdominal:     General: Abdomen is flat.     Palpations: Abdomen  is soft.     Tenderness: There is no abdominal tenderness. There is no guarding.  Musculoskeletal:        General: No deformity. Normal range of motion.     Cervical back: No rigidity or tenderness.     Comments: No tenderness to palpation of midline thoracic or lumbar spine.  Paraspinal tenderness to palpation at L5 on the right side.  No step-offs palpated.  No tenderness to palpation of chest wall.  No bruising noted.  No tenderness to palpation of bilateral clavicles.  No tenderness to palpation, bruising, or deformities noted of bilateral shoulders, elbows, wrists, hips, or knees.  Tenderness to palpation of left lateral malleolus and left foot  Neurological:     General: No focal deficit present.     Mental Status: She is alert and oriented to person, place, and time. Mental status is at baseline.     Cranial Nerves: No cranial nerve deficit.      Sensory: No sensory deficit.     Motor: No weakness.     ED Results / Procedures / Treatments   Labs (all labs ordered are listed, but only abnormal results are displayed) Labs Reviewed - No data to display  EKG None  Radiology No results found.  Procedures Procedures  {Document cardiac monitor, telemetry assessment procedure when appropriate:1}  Medications Ordered in ED Medications  acetaminophen (TYLENOL) tablet 1,000 mg (has no administration in time range)  oxyCODONE (Oxy IR/ROXICODONE) immediate release tablet 5 mg (has no administration in time range)  ondansetron (ZOFRAN-ODT) disintegrating tablet 4 mg (has no administration in time range)    ED Course/ Medical Decision Making/ A&P   {   Click here for ABCD2, HEART and other calculatorsREFRESH Note before signing :1}                          Medical Decision Making Amount and/or Complexity of Data Reviewed Radiology: ordered.  Risk OTC drugs. Prescription drug management.   ***  {Document critical care time when appropriate:1} {Document review of labs and clinical decision tools ie heart score, Chads2Vasc2 etc:1}  {Document your independent review of radiology images, and any outside records:1} {Document your discussion with family members, caretakers, and with consultants:1} {Document social determinants of health affecting pt's care:1} {Document your decision making why or why not admission, treatments were needed:1} Final Clinical Impression(s) / ED Diagnoses Final diagnoses:  None    Rx / DC Orders ED Discharge Orders     None

## 2023-01-17 ENCOUNTER — Ambulatory Visit
Admission: EM | Admit: 2023-01-17 | Discharge: 2023-01-17 | Discharge status: Against Medical Advice | Payer: Medicaid Other

## 2023-12-28 ENCOUNTER — Encounter: Payer: Self-pay | Admitting: Advanced Practice Midwife
